# Patient Record
Sex: Male | Born: 1965 | Race: White | Hispanic: No | Marital: Married | Smoking: Never smoker
Health system: Southern US, Community
[De-identification: ages and names within clinical notes are randomized; demographics above are authoritative.]

## PROBLEM LIST (undated history)

## (undated) DIAGNOSIS — I255 Ischemic cardiomyopathy: Secondary | ICD-10-CM

## (undated) DIAGNOSIS — E8881 Metabolic syndrome: Secondary | ICD-10-CM

## (undated) DIAGNOSIS — Z9861 Coronary angioplasty status: Secondary | ICD-10-CM

## (undated) DIAGNOSIS — I2510 Atherosclerotic heart disease of native coronary artery without angina pectoris: Secondary | ICD-10-CM

## (undated) DIAGNOSIS — E785 Hyperlipidemia, unspecified: Secondary | ICD-10-CM

## (undated) DIAGNOSIS — I214 Non-ST elevation (NSTEMI) myocardial infarction: Secondary | ICD-10-CM

## (undated) DIAGNOSIS — R739 Hyperglycemia, unspecified: Secondary | ICD-10-CM

## (undated) DIAGNOSIS — N200 Calculus of kidney: Secondary | ICD-10-CM

## (undated) HISTORY — DX: Ischemic cardiomyopathy: I25.5

## (undated) HISTORY — DX: Metabolic syndrome: E88.81

## (undated) HISTORY — DX: Non-ST elevation (NSTEMI) myocardial infarction: I21.4

## (undated) HISTORY — PX: TRANSTHORACIC ECHOCARDIOGRAM: SHX275

## (undated) HISTORY — DX: Hyperlipidemia, unspecified: E78.5

## (undated) HISTORY — DX: Morbid (severe) obesity due to excess calories: E66.01

## (undated) HISTORY — DX: Hyperglycemia, unspecified: R73.9

---

## 2008-06-22 ENCOUNTER — Encounter: Admission: RE | Admit: 2008-06-22 | Discharge: 2008-06-22 | Payer: Self-pay | Admitting: Orthopaedic Surgery

## 2012-09-26 ENCOUNTER — Emergency Department (HOSPITAL_COMMUNITY): Payer: PRIVATE HEALTH INSURANCE

## 2012-09-26 ENCOUNTER — Encounter (HOSPITAL_COMMUNITY): Payer: Self-pay | Admitting: *Deleted

## 2012-09-26 ENCOUNTER — Inpatient Hospital Stay (HOSPITAL_COMMUNITY)
Admission: EM | Admit: 2012-09-26 | Discharge: 2012-09-30 | DRG: 247 | Disposition: A | Discharge status: Home / Self Care | Payer: PRIVATE HEALTH INSURANCE | Attending: Cardiology | Admitting: Cardiology

## 2012-09-26 DIAGNOSIS — N200 Calculus of kidney: Secondary | ICD-10-CM | POA: Diagnosis present

## 2012-09-26 DIAGNOSIS — R809 Proteinuria, unspecified: Secondary | ICD-10-CM | POA: Diagnosis present

## 2012-09-26 DIAGNOSIS — Z87442 Personal history of urinary calculi: Secondary | ICD-10-CM

## 2012-09-26 DIAGNOSIS — Z955 Presence of coronary angioplasty implant and graft: Secondary | ICD-10-CM

## 2012-09-26 DIAGNOSIS — E785 Hyperlipidemia, unspecified: Secondary | ICD-10-CM | POA: Diagnosis present

## 2012-09-26 DIAGNOSIS — E876 Hypokalemia: Secondary | ICD-10-CM | POA: Diagnosis not present

## 2012-09-26 DIAGNOSIS — E1169 Type 2 diabetes mellitus with other specified complication: Secondary | ICD-10-CM | POA: Diagnosis present

## 2012-09-26 DIAGNOSIS — E118 Type 2 diabetes mellitus with unspecified complications: Secondary | ICD-10-CM | POA: Diagnosis present

## 2012-09-26 DIAGNOSIS — I214 Non-ST elevation (NSTEMI) myocardial infarction: Secondary | ICD-10-CM

## 2012-09-26 DIAGNOSIS — E8881 Metabolic syndrome: Secondary | ICD-10-CM | POA: Diagnosis present

## 2012-09-26 DIAGNOSIS — IMO0002 Reserved for concepts with insufficient information to code with codable children: Secondary | ICD-10-CM | POA: Diagnosis present

## 2012-09-26 DIAGNOSIS — R739 Hyperglycemia, unspecified: Secondary | ICD-10-CM | POA: Diagnosis present

## 2012-09-26 DIAGNOSIS — Z6841 Body Mass Index (BMI) 40.0 and over, adult: Secondary | ICD-10-CM

## 2012-09-26 DIAGNOSIS — I2510 Atherosclerotic heart disease of native coronary artery without angina pectoris: Secondary | ICD-10-CM | POA: Diagnosis present

## 2012-09-26 DIAGNOSIS — D72829 Elevated white blood cell count, unspecified: Secondary | ICD-10-CM | POA: Diagnosis present

## 2012-09-26 HISTORY — DX: Calculus of kidney: N20.0

## 2012-09-26 LAB — URINALYSIS, ROUTINE W REFLEX MICROSCOPIC
Glucose, UA: >1000 mg/dL — AB
Ketones, ur: 15 mg/dL — AB
Leukocytes, UA: NEGATIVE
Nitrite: NEGATIVE
Protein, ur: 30 mg/dL — AB

## 2012-09-26 LAB — BASIC METABOLIC PANEL
CO2: 27 mEq/L (ref 19–32)
Calcium: 9.3 mg/dL (ref 8.4–10.5)
GFR calc non Af Amer: >90 mL/min (ref >90)
Glucose, Bld: 438 mg/dL — ABNORMAL HIGH (ref 70–99)
Potassium: 4.6 mEq/L (ref 3.5–5.1)
Sodium: 134 mEq/L — ABNORMAL LOW (ref 135–145)

## 2012-09-26 LAB — CBC
Hemoglobin: 15.6 g/dL (ref 13.0–17.0)
MCH: 30.1 pg (ref 26.0–34.0)
MCHC: 35.5 g/dL (ref 30.0–36.0)
Platelets: 249 10*3/uL (ref 150–400)
RBC: 5.19 MIL/uL (ref 4.22–5.81)

## 2012-09-26 LAB — URINE MICROSCOPIC-ADD ON

## 2012-09-26 LAB — POCT I-STAT TROPONIN I
Troponin i, poc: 0.44 ng/mL (ref 0.00–0.08)
Troponin i, poc: 2.53 ng/mL (ref 0.00–0.08)

## 2012-09-26 LAB — PROTIME-INR: Prothrombin Time: 13.2 seconds (ref 11.6–15.2)

## 2012-09-26 LAB — APTT: aPTT: 26 seconds (ref 24–37)

## 2012-09-26 MED ORDER — HEPARIN (PORCINE) IN NACL 100-0.45 UNIT/ML-% IJ SOLN
1000.0000 [IU]/h | INTRAMUSCULAR | Status: DC
Start: 2012-09-26 — End: 2012-09-26

## 2012-09-26 MED ORDER — SODIUM CHLORIDE 0.9 % IV SOLN
1000.0000 mL | INTRAVENOUS | Status: DC
Start: 2012-09-26 — End: 2012-09-26
  Administered 2012-09-26: 1000 mL via INTRAVENOUS

## 2012-09-26 MED ORDER — NITROGLYCERIN 0.4 MG SL SUBL: 0.4000 mg | SUBLINGUAL_TABLET | SUBLINGUAL | Status: DC | PRN

## 2012-09-26 MED ORDER — HEPARIN (PORCINE) IN NACL 100-0.45 UNIT/ML-% IJ SOLN
1850.0000 [IU]/h | INTRAMUSCULAR | Status: DC
  Administered 2012-09-26: 1250 [IU]/h via INTRAVENOUS
  Administered 2012-09-27 (×2): 1850 [IU]/h via INTRAVENOUS
  Filled 2012-09-26 (×5): qty 250

## 2012-09-26 MED ORDER — HEPARIN BOLUS VIA INFUSION: 4000.0000 [IU] | Freq: Once | INTRAVENOUS | Status: DC

## 2012-09-26 MED ORDER — ONDANSETRON HCL 4 MG/2ML IJ SOLN
4.0000 mg | Freq: Four times a day (QID) | INTRAMUSCULAR | Status: DC | PRN
  Administered 2012-09-27: 4 mg via INTRAVENOUS
  Filled 2012-09-26: qty 2

## 2012-09-26 MED ORDER — NITROGLYCERIN IN D5W 200-5 MCG/ML-% IV SOLN: 2.0000 ug/min | INTRAVENOUS | Status: DC

## 2012-09-26 MED ORDER — NITROGLYCERIN IN D5W 200-5 MCG/ML-% IV SOLN
2.0000 ug/min | Freq: Once | INTRAVENOUS | Status: AC
  Administered 2012-09-26: 5 ug/min via INTRAVENOUS
  Filled 2012-09-26: qty 250

## 2012-09-26 MED ORDER — SODIUM CHLORIDE 0.9 % IV SOLN
INTRAVENOUS | Status: DC
  Administered 2012-09-26: via INTRAVENOUS

## 2012-09-26 MED ORDER — INSULIN ASPART 100 UNIT/ML ~~LOC~~ SOLN
0.0000 [IU] | SUBCUTANEOUS | Status: DC
  Administered 2012-09-27: 11 [IU] via SUBCUTANEOUS
  Administered 2012-09-27: 8 [IU] via SUBCUTANEOUS
  Administered 2012-09-27: 5 [IU] via SUBCUTANEOUS

## 2012-09-26 MED ORDER — ACETAMINOPHEN 325 MG PO TABS
650.0000 mg | ORAL_TABLET | ORAL | Status: DC | PRN
  Administered 2012-09-27 – 2012-09-28 (×7): 650 mg via ORAL
  Filled 2012-09-26 (×7): qty 2

## 2012-09-26 MED ORDER — ASPIRIN EC 81 MG PO TBEC
81.0000 mg | DELAYED_RELEASE_TABLET | Freq: Every day | ORAL | Status: DC
  Administered 2012-09-27 – 2012-09-30 (×3): 81 mg via ORAL
  Filled 2012-09-26 (×4): qty 1

## 2012-09-26 MED ORDER — HEPARIN BOLUS VIA INFUSION
4000.0000 [IU] | Freq: Once | INTRAVENOUS | Status: AC
  Administered 2012-09-26: 4000 [IU] via INTRAVENOUS

## 2012-09-26 MED ORDER — ATORVASTATIN CALCIUM 80 MG PO TABS
80.0000 mg | ORAL_TABLET | Freq: Every day | ORAL | Status: DC
  Administered 2012-09-27 – 2012-09-29 (×4): 80 mg via ORAL
  Filled 2012-09-26 (×5): qty 1

## 2012-09-26 MED ORDER — METOPROLOL TARTRATE 12.5 MG HALF TABLET
12.5000 mg | ORAL_TABLET | Freq: Two times a day (BID) | ORAL | Status: DC
  Administered 2012-09-27: 12.5 mg via ORAL
  Filled 2012-09-26 (×3): qty 1

## 2012-09-26 MED ORDER — NITROGLYCERIN 0.4 MG SL SUBL
0.4000 mg | SUBLINGUAL_TABLET | SUBLINGUAL | Status: DC | PRN
  Administered 2012-09-26: 0.4 mg via SUBLINGUAL
  Filled 2012-09-26: qty 25

## 2012-09-26 MED ORDER — TAMSULOSIN HCL 0.4 MG PO CAPS
0.4000 mg | ORAL_CAPSULE | Freq: Every day | ORAL | Status: DC
  Administered 2012-09-27 – 2012-09-29 (×3): 0.4 mg via ORAL
  Filled 2012-09-26 (×4): qty 1

## 2012-09-26 MED ORDER — GI COCKTAIL ~~LOC~~
30.0000 mL | Freq: Once | ORAL | Status: AC
  Administered 2012-09-26: 30 mL via ORAL
  Filled 2012-09-26: qty 30

## 2012-09-26 NOTE — ED Notes (Signed)
Dr. Davidson was notified of the critical i-STAT cTnl. 

## 2012-09-26 NOTE — ED Notes (Signed)
C/o onset SSCP non radiating while looking at [employer], non exertional. Denies SOB, n/v, diaphoresis. States pain intensity varies while at rest. Reports pain intermittent, describes pain as pressure. States pain increases with palpation.

## 2012-09-26 NOTE — ED Notes (Signed)
EDP made aware of CP, states to run Nitro drip at 66mcg/min

## 2012-09-26 NOTE — ED Notes (Signed)
Dr. Ignacia Palma was notified of the critical i-STAT cTnl.

## 2012-09-26 NOTE — ED Provider Notes (Signed)
History     CSN: [id number]  Arrival date & time 09/26/12  1459   First MD Initiated Contact with Patient 09/26/12 1523      Chief Complaint  Patient presents with  . Chest Pain    (Consider location/radiation/quality/duration/timing/severity/associated sxs/prior treatment) Patient is a 47 y.o. male presenting with chest pain. The history is provided by the patient. No language interpreter was used.  Chest Pain Pain location:  Substernal area (Pt had eaten at [employer] about 30-45 minutes prior to onset of chest pain.  He was uncrating a [employer], newspaper and felt onset of substernal chest pain.) Pain quality: dull   Pain quality comment:  Pain rated at a 7 initially, now is a 2 or less. Pain radiates to:  Does not radiate Pain severity:  Moderate Onset quality:  Sudden Duration:  45 minutes Timing:  Constant Progression:  Partially resolved Chronicity:  New Relieved by:  Aspirin Worsened by:  Nothing tried Ineffective treatments:  None tried Associated symptoms: no fever and no palpitations   Risk factors: male sex and obesity     Past Medical History  Diagnosis Date  . Kidney stone     History reviewed. No pertinent past surgical history.  No family history on file.  History  Substance Use Topics  . Smoking status: Not on file  . Smokeless tobacco: Not on file  . Alcohol Use: Not on file      Review of Systems  Constitutional: Negative.  Negative for fever and chills.  HENT: Negative.   Eyes: Negative.   Respiratory: Negative.   Cardiovascular: Positive for chest pain. Negative for palpitations and leg swelling.  Gastrointestinal: Negative.        Some prior Hx of esophageal reflux.  Genitourinary: Negative.        Prior Hx kidney stones.  Musculoskeletal: Negative.   Skin:       Sweating with this episode.  Neurological: Negative.   Psychiatric/Behavioral: Negative.     Allergies  Review of patient's allergies indicates no known  allergies.  Home Medications  No current outpatient prescriptions on file.  BP 139/96  Pulse 72  Temp(Src) 98 F (36.7 C) (Oral)  Resp 18  Ht 5\' 8"  (1.727 m)  Wt 300 lb (136.079 kg)  BMI 45.63 kg/m2  SpO2 98%  Physical Exam  Nursing note and vitals reviewed. Constitutional: He is oriented to person, place, and time.  Morbidly obese man, no distress at rest.  HENT:  Head: Normocephalic and atraumatic.  Right Ear: External ear normal.  Left Ear: External ear normal.  Mouth/Throat: Oropharynx is clear and moist.  Eyes: Conjunctivae and EOM are normal. Pupils are equal, round, and reactive to light.  Neck: Normal range of motion. Neck supple.  Cardiovascular: Normal rate, regular rhythm and normal heart sounds.   Pulmonary/Chest: Effort normal and breath sounds normal.  Abdominal: Soft. Bowel sounds are normal. He exhibits no distension. There is no tenderness.  Musculoskeletal: Normal range of motion. He exhibits no edema and no tenderness.  Neurological: He is alert and oriented to person, place, and time.  No sensory or motor deficit.  Skin: Skin is warm and dry.  Psychiatric: He has a normal mood and affect. His behavior is normal.    ED Course  CRITICAL CARE Performed by: Osvaldo Human Authorized by: Osvaldo Human Total critical care time: 30 minutes Critical care was necessary to treat or prevent imminent or life-threatening deterioration of the following conditions: 47 yo man  with vague chest pain, initial EKG and troponin negative, but 3 hour troponin was positive, leading to Dx of NSTEMI.  Rx with IV nitroglycerin and heparin, Cardiology called to admit him. Critical care was time spent personally by me on the following activities: discussions with consultants, evaluation of patient's response to treatment, examination of patient, obtaining history from patient or surrogate, ordering and performing treatments and interventions, ordering and review of  laboratory studies, ordering and review of radiographic studies and re-evaluation of patient's condition.   (including critical care time)  Labs Reviewed  CBC  BASIC METABOLIC PANEL   0:98 PM  Date: 09/26/2012  Rate: 70  Rhythm: normal sinus rhythm  QRS Axis: normal  Intervals: normal QRS:  Poor R wave progression in precordial leads suggests possible old anterior myocardial infarction.  ST/T Wave abnormalities: normal  Conduction Disutrbances:none  Narrative Interpretation: Abnormal EKG  Old EKG Reviewed: none available  3:42 PM Pt was seen and had physical examination.  He rated his pain at a 7 at home, now it is a 2.  Lab workup ordered.  EKG nonacute.  Results for orders placed during the hospital encounter of 09/26/12  MRSA PCR SCREENING      Result Value Range   MRSA by PCR NEGATIVE  NEGATIVE  CBC      Result Value Range   WBC 12.0 (*) 4.0 - 10.5 K/uL   RBC 5.19  4.22 - 5.81 MIL/uL   Hemoglobin 15.6  13.0 - 17.0 g/dL   HCT 11.9  14.7 - 82.9 %   MCV 84.6  78.0 - 100.0 fL   MCH 30.1  26.0 - 34.0 pg   MCHC 35.5  30.0 - 36.0 g/dL   RDW 56.2  13.0 - 86.5 %   Platelets 249  150 - 400 K/uL  BASIC METABOLIC PANEL      Result Value Range   Sodium 134 (*) 135 - 145 mEq/L   Potassium 4.6  3.5 - 5.1 mEq/L   Chloride 97  96 - 112 mEq/L   CO2 27  19 - 32 mEq/L   Glucose, Bld 438 (*) 70 - 99 mg/dL   BUN 9  6 - 23 mg/dL   Creatinine, Ser 7.84  0.50 - 1.35 mg/dL   Calcium 9.3  8.4 - 69.6 mg/dL   GFR calc non Af Amer >90  >90 mL/min   GFR calc Af Amer >90  >90 mL/min  APTT      Result Value Range   aPTT 26  24 - 37 seconds  URINALYSIS, ROUTINE W REFLEX MICROSCOPIC      Result Value Range   Color, Urine YELLOW  YELLOW   APPearance CLEAR  CLEAR   Specific Gravity, Urine 1.031 (*) 1.005 - 1.030   pH 7.5  5.0 - 8.0   Glucose, UA >1000 (*) NEGATIVE mg/dL   Hgb urine dipstick SMALL (*) NEGATIVE   Bilirubin Urine NEGATIVE  NEGATIVE   Ketones, ur 15 (*) NEGATIVE mg/dL    Protein, ur 30 (*) NEGATIVE mg/dL   Urobilinogen, UA 0.2  0.0 - 1.0 mg/dL   Nitrite NEGATIVE  NEGATIVE   Leukocytes, UA NEGATIVE  NEGATIVE  PROTIME-INR      Result Value Range   Prothrombin Time 13.2  11.6 - 15.2 seconds   INR 1.01  0.00 - 1.49  URINE MICROSCOPIC-ADD ON      Result Value Range   WBC, UA 0-2  <3 WBC/hpf   RBC / HPF 3-6  <  3 RBC/hpf  TROPONIN I      Result Value Range   Troponin I 10.55 (*) <0.30 ng/mL  TROPONIN I      Result Value Range   Troponin I >20.00 (*) <0.30 ng/mL  PROTIME-INR      Result Value Range   Prothrombin Time 13.5  11.6 - 15.2 seconds   INR 1.04  0.00 - 1.49  APTT      Result Value Range   aPTT 38 (*) 24 - 37 seconds  TSH      Result Value Range   TSH 0.473  0.350 - 4.500 uIU/mL  COMPREHENSIVE METABOLIC PANEL      Result Value Range   Sodium 137  135 - 145 mEq/L   Potassium 4.4  3.5 - 5.1 mEq/L   Chloride 101  96 - 112 mEq/L   CO2 26  19 - 32 mEq/L   Glucose, Bld 311 (*) 70 - 99 mg/dL   BUN 9  6 - 23 mg/dL   Creatinine, Ser 0.98  0.50 - 1.35 mg/dL   Calcium 8.7  8.4 - 11.9 mg/dL   Total Protein 6.7  6.0 - 8.3 g/dL   Albumin 3.4 (*) 3.5 - 5.2 g/dL   AST 51 (*) 0 - 37 U/L   ALT 32  0 - 53 U/L   Alkaline Phosphatase 104  39 - 117 U/L   Total Bilirubin 0.7  0.3 - 1.2 mg/dL   GFR calc non Af Amer >90  >90 mL/min   GFR calc Af Amer >90  >90 mL/min  PRO B NATRIURETIC PEPTIDE      Result Value Range   Pro B Natriuretic peptide (BNP) 95.6  0 - 125 pg/mL  HEMOGLOBIN A1C      Result Value Range   Hemoglobin A1C 11.3 (*) <5.7 %   Mean Plasma Glucose 278 (*) <117 mg/dL  LIPID PANEL      Result Value Range   Cholesterol 248 (*) 0 - 200 mg/dL   Triglycerides 147 (*) <150 mg/dL   HDL 46  >82 mg/dL   Total CHOL/HDL Ratio 5.4     VLDL 36  0 - 40 mg/dL   LDL Cholesterol 956 (*) 0 - 99 mg/dL  BASIC METABOLIC PANEL      Result Value Range   Sodium 137  135 - 145 mEq/L   Potassium 4.0  3.5 - 5.1 mEq/L   Chloride 102  96 - 112 mEq/L   CO2 25   19 - 32 mEq/L   Glucose, Bld 264 (*) 70 - 99 mg/dL   BUN 8  6 - 23 mg/dL   Creatinine, Ser 2.13  0.50 - 1.35 mg/dL   Calcium 8.5  8.4 - 08.6 mg/dL   GFR calc non Af Amer >90  >90 mL/min   GFR calc Af Amer >90  >90 mL/min  CBC      Result Value Range   WBC 12.3 (*) 4.0 - 10.5 K/uL   RBC 4.96  4.22 - 5.81 MIL/uL   Hemoglobin 14.9  13.0 - 17.0 g/dL   HCT 57.8  46.9 - 62.9 %   MCV 84.9  78.0 - 100.0 fL   MCH 30.0  26.0 - 34.0 pg   MCHC 35.4  30.0 - 36.0 g/dL   RDW 52.8  41.3 - 24.4 %   Platelets 256  150 - 400 K/uL  MAGNESIUM      Result Value Range   Magnesium 1.9  1.5 -  2.5 mg/dL  HEPARIN LEVEL (UNFRACTIONATED)      Result Value Range   Heparin Unfractionated <0.10 (*) 0.30 - 0.70 IU/mL  HEPARIN LEVEL (UNFRACTIONATED)      Result Value Range   Heparin Unfractionated 0.36  0.30 - 0.70 IU/mL  GLUCOSE, CAPILLARY      Result Value Range   Glucose-Capillary 219 (*) 70 - 99 mg/dL  URINALYSIS, ROUTINE W REFLEX MICROSCOPIC      Result Value Range   Color, Urine YELLOW  YELLOW   APPearance CLEAR  CLEAR   Specific Gravity, Urine 1.025  1.005 - 1.030   pH 7.0  5.0 - 8.0   Glucose, UA >1000 (*) NEGATIVE mg/dL   Hgb urine dipstick MODERATE (*) NEGATIVE   Bilirubin Urine NEGATIVE  NEGATIVE   Ketones, ur 15 (*) NEGATIVE mg/dL   Protein, ur 30 (*) NEGATIVE mg/dL   Urobilinogen, UA 1.0  0.0 - 1.0 mg/dL   Nitrite NEGATIVE  NEGATIVE   Leukocytes, UA NEGATIVE  NEGATIVE  GLUCOSE, CAPILLARY      Result Value Range   Glucose-Capillary 304 (*) 70 - 99 mg/dL  GLUCOSE, CAPILLARY      Result Value Range   Glucose-Capillary 279 (*) 70 - 99 mg/dL  GLUCOSE, CAPILLARY      Result Value Range   Glucose-Capillary 262 (*) 70 - 99 mg/dL  URINE MICROSCOPIC-ADD ON      Result Value Range   WBC, UA 0-2  <3 WBC/hpf   RBC / HPF 3-6  <3 RBC/hpf  POCT I-STAT TROPONIN I      Result Value Range   Troponin i, poc 0.02  0.00 - 0.08 ng/mL   Comment 3           POCT I-STAT TROPONIN I      Result Value  Range   Troponin i, poc 0.44 (*) 0.00 - 0.08 ng/mL   Comment NOTIFIED PHYSICIAN     Comment 3           POCT I-STAT TROPONIN I      Result Value Range   Troponin i, poc 2.53 (*) 0.00 - 0.08 ng/mL   Comment NOTIFIED PHYSICIAN     Comment 3            Dg Chest 2 View  09/26/2012  *RADIOLOGY REPORT*  Clinical Data: Chest pain.  CHEST - 2 VIEW  Comparison: None  Findings: The cardiomediastinal silhouette is unremarkable.  Mild peribronchial thickening is present. There is no evidence of focal airspace disease, pulmonary edema, suspicious pulmonary nodule/mass, pleural effusion, or pneumothorax. No acute bony abnormalities are identified.  IMPRESSION: No evidence of acute cardiopulmonary disease.   Original Report Authenticated By: Harmon Pier, M.D.     Course in ED:  Pt was seen and had physical examination.  Lab tests were ordered.  Initial EKG and TNI were negative, but his 3 hour TNI was high, showing that he had had an NSTEMI.  IV NTG and heparin were started.  [facility] was called to admit him.   1. NSTEMI (non-ST elevated myocardial infarction)         Carleene Cooper III, MD 09/27/12 [phone]

## 2012-09-26 NOTE — Progress Notes (Signed)
ANTICOAGULATION CONSULT NOTE - Initial Consult  Pharmacy Consult for Heparin Indication: NSTEMI  No Known Allergies  Patient Measurements: Height: 5\' 8"  (172.7 cm) Weight: 293 lb (132.904 kg) IBW/kg (Calculated) : 68.4 Heparin Dosing Weight: 99.8kg  Vital Signs: Temp: 98 F (36.7 C) (05/10 1509) Temp src: Oral (05/10 1509) BP: 132/90 mmHg (05/10 1915) Pulse Rate: 74 (05/10 1915)  Labs:  Recent Labs  09/26/12 1544  HGB 15.6  HCT 43.9  PLT 249  APTT 26  LABPROT 13.2  INR 1.01  CREATININE 0.95    Estimated Creatinine Clearance: 130.8 ml/min (by C-G formula based on Cr of 0.95).   Medical History: Past Medical History  Diagnosis Date  . Kidney stone     Medications:  Scheduled:  . [COMPLETED] gi cocktail  30 mL Oral Once  . nitroGLYCERIN  2-200 mcg/min Intravenous Once    Assessment: 47 yr old male on heparin for NSTEMI.    Goal of Therapy:  Heparin level 0.3-0.7 units/ml Monitor platelets by anticoagulation protocol: Yes   Plan:  Heparin 4000 units IV bolus x1, then run heparin drip at 1250 units/hr. Check heparin level in 6 hours. Daily heparin level/cbc.  Wendie Simmer, PharmD, BCPS Clinical Pharmacist  Pager: [phone]

## 2012-09-26 NOTE — ED Notes (Signed)
C/o SSCP onset 30 minutes after eating [employer]. Denies SOB, n/v, diaphoresis. Pt took 324mg  ASA, given NTG x2 by EMS with relief.

## 2012-09-26 NOTE — ED Provider Notes (Signed)
History     CSN: [id number]  Arrival date & time 09/26/12  1459   First MD Initiated Contact with Patient 09/26/12 1523      Chief Complaint  Patient presents with  . Chest Pain    (Consider location/radiation/quality/duration/timing/severity/associated sxs/prior treatment) Patient is a 47 y.o. male presenting with chest pain. The history is provided by the patient. No language interpreter was used.  Chest Pain Pain location:  Substernal area (Patient had been at [employer] 30-[place] minutes prior to the onset of chest pain.) Pain quality: dull   Pain quality comment:  Pain was like a twinge of pain in the chest that did not go away with rest.   Pain radiates to:  Does not radiate Pain severity:  Moderate (Rated initially at a 7, on arrival here rated at a 2.  ) Onset quality:  Sudden Timing:  Intermittent Progression:  Waxing and waning Chronicity:  New Context: eating   Relieved by:  Nothing Worsened by:  Nothing tried Ineffective treatments:  Aspirin Associated symptoms: no cough, no fever and no shortness of breath     Past Medical History  Diagnosis Date  . Kidney stone     History reviewed. No pertinent past surgical history.  No family history on file.  History  Substance Use Topics  . Smoking status: Not on file  . Smokeless tobacco: Not on file  . Alcohol Use: Not on file      Review of Systems  Constitutional: Negative for fever and chills.  HENT: Negative.   Eyes: Negative.   Respiratory: Negative.  Negative for cough and shortness of breath.   Cardiovascular: Positive for chest pain.  Gastrointestinal: Negative.   Genitourinary: Negative.   Musculoskeletal: Negative.   Skin: Negative.   Neurological: Negative.   Psychiatric/Behavioral: Negative.     Allergies  Review of patient's allergies indicates no known allergies.  Home Medications   Current Outpatient Rx  Name  Route  Sig  Dispense  Refill  . aspirin EC 81 MG tablet   Oral   Take 324  mg by mouth once.         . tamsulosin (FLOMAX) 0.4 MG CAPS   Oral   Take 0.4 mg by mouth daily after supper.           BP 139/96  Pulse 72  Temp(Src) 98 F (36.7 C) (Oral)  Resp 18  Ht 5\' 8"  (1.727 m)  Wt 300 lb (136.079 kg)  BMI 45.63 kg/m2  SpO2 98%  Physical Exam  Nursing note and vitals reviewed. Constitutional: He is oriented to person, place, and time. He appears well-developed.  Morbidly obese man in no distress at rest.  HENT:  Head: Normocephalic and atraumatic.  Right Ear: External ear normal.  Left Ear: External ear normal.  Mouth/Throat: Oropharynx is clear and moist.  Eyes: Conjunctivae and EOM are normal. Pupils are equal, round, and reactive to light. No scleral icterus.  Neck: Normal range of motion. Neck supple.  Cardiovascular: Normal rate, regular rhythm and normal heart sounds.   Pulmonary/Chest: Effort normal and breath sounds normal.  Abdominal: Soft. Bowel sounds are normal.  Musculoskeletal: Normal range of motion. He exhibits no edema and no tenderness.  Neurological: He is alert and oriented to person, place, and time.  No sensory or motor deficit.  Skin: Skin is warm and dry.  Psychiatric: He has a normal mood and affect. His behavior is normal.    ED Course  Procedures (including critical care  time)  Results for orders placed during the hospital encounter of 09/26/12  CBC      Result Value Range   WBC 12.0 (*) 4.0 - 10.5 K/uL   RBC 5.19  4.22 - 5.81 MIL/uL   Hemoglobin 15.6  13.0 - 17.0 g/dL   HCT 16.1  09.6 - 04.5 %   MCV 84.6  78.0 - 100.0 fL   MCH 30.1  26.0 - 34.0 pg   MCHC 35.5  30.0 - 36.0 g/dL   RDW 40.9  81.1 - 91.4 %   Platelets 249  150 - 400 K/uL  BASIC METABOLIC PANEL      Result Value Range   Sodium 134 (*) 135 - 145 mEq/L   Potassium 4.6  3.5 - 5.1 mEq/L   Chloride 97  96 - 112 mEq/L   CO2 27  19 - 32 mEq/L   Glucose, Bld 438 (*) 70 - 99 mg/dL   BUN 9  6 - 23 mg/dL   Creatinine, Ser 7.82  0.50 - 1.35 mg/dL    Calcium 9.3  8.4 - 95.6 mg/dL   GFR calc non Af Amer >90  >90 mL/min   GFR calc Af Amer >90  >90 mL/min  APTT      Result Value Range   aPTT 26  24 - 37 seconds  URINALYSIS, ROUTINE W REFLEX MICROSCOPIC      Result Value Range   Color, Urine YELLOW  YELLOW   APPearance CLEAR  CLEAR   Specific Gravity, Urine 1.031 (*) 1.005 - 1.030   pH 7.5  5.0 - 8.0   Glucose, UA >1000 (*) NEGATIVE mg/dL   Hgb urine dipstick SMALL (*) NEGATIVE   Bilirubin Urine NEGATIVE  NEGATIVE   Ketones, ur 15 (*) NEGATIVE mg/dL   Protein, ur 30 (*) NEGATIVE mg/dL   Urobilinogen, UA 0.2  0.0 - 1.0 mg/dL   Nitrite NEGATIVE  NEGATIVE   Leukocytes, UA NEGATIVE  NEGATIVE  PROTIME-INR      Result Value Range   Prothrombin Time 13.2  11.6 - 15.2 seconds   INR 1.01  0.00 - 1.49  URINE MICROSCOPIC-ADD ON      Result Value Range   WBC, UA 0-2  <3 WBC/hpf   RBC / HPF 3-6  <3 RBC/hpf  POCT I-STAT TROPONIN I      Result Value Range   Troponin i, poc 0.02  0.00 - 0.08 ng/mL   Comment 3           POCT I-STAT TROPONIN I      Result Value Range   Troponin i, poc 0.44 (*) 0.00 - 0.08 ng/mL   Comment NOTIFIED PHYSICIAN     Comment 3            Dg Chest 2 View  09/26/2012  *RADIOLOGY REPORT*  Clinical Data: Chest pain.  CHEST - 2 VIEW  Comparison: None  Findings: The cardiomediastinal silhouette is unremarkable.  Mild peribronchial thickening is present. There is no evidence of focal airspace disease, pulmonary edema, suspicious pulmonary nodule/mass, pleural effusion, or pneumothorax. No acute bony abnormalities are identified.  IMPRESSION: No evidence of acute cardiopulmonary disease.   Original Report Authenticated By: Harmon Pier, M.D.     Course in ED:  Pt seen and had physical exam and lab tests.  His EKG was nonacute, his initial TNI was negative, but his 3 hour TNI was elevated at 0.44.  Call to Leeann Must, M.D., on call for Cardiology, who will  admit him.  Rx with IV heparin, IV nitroglycerin.  Lab results and  course of treatment discussed with pt and with his wife.    1. NSTEMI (non-ST elevated myocardial infarction)           Carleene Cooper III, MD 09/26/12 2020

## 2012-09-26 NOTE — H&P (Signed)
Thomas Beck is an 47 y.o. male.   Chief Complaint: chest pain HPI: 47 yo man with PMH of multiple kidney stones (90+) who presented to [facility] ER after developing chest pain 30-45 minutes after eating at [employer] while he was opening up a new lawn mower. He described the pain as pressure sensation, no radiation and located substernally. He has no arm/neck pain and no radiation. Pain as bad as 7/10 and down to 2/10 immediately after aspirin, nitroglycerin. He currently has between no pain and 1/10. He has received a large aspirin. The ER called cardiology when his troponin I resulted at 0.44 consistent with an NSTEMI. IV heparin already ordered and currently infusing on my evaluation. 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 He walks 4 flights of stairs multiple times a day without difficulty. He also plays golf twice a week. He's never had chest pain before. He sleeps on 1-2 pillows, no SOB/DOE or PND. His family is healthy - no known diabetes, hypertension or CAD; however, his maternal grandmother died suddenly from unknown cause at age 16. He's lost from 331 lbs to 293 lbs walking more and eating better. He is not known to have diabetes but I informed him of his elevated sugars. He understands that he has what appears to be a small NSTEMI at this time.    Past Medical History  Diagnosis Date  . Kidney stone     History reviewed. No pertinent past surgical history.  No family history on file. Social History:  has no tobacco, alcohol, and drug history on file. No tobacco, social etoh, no drugs Allergies: No Known Allergies   (Not in a hospital admission)  Results for orders placed during the hospital encounter of 09/26/12 (from the past 48 hour(s))  CBC     Status: Abnormal   Collection Time    09/26/12  3:44 PM      Result Value Range   WBC 12.0 (*) 4.0 - 10.5 K/uL   RBC 5.19  4.22 - 5.81 MIL/uL   Hemoglobin 15.6   13.0 - 17.0 g/dL   HCT 78.2  95.6 - 21.3 %   MCV 84.6  78.0 - 100.0 fL   MCH 30.1  26.0 - 34.0 pg   MCHC 35.5  30.0 - 36.0 g/dL   RDW 08.6  57.8 - 46.9 %   Platelets 249  150 - 400 K/uL  BASIC METABOLIC PANEL     Status: Abnormal   Collection Time    09/26/12  3:44 PM      Result Value Range   Sodium 134 (*) 135 - 145 mEq/L   Potassium 4.6  3.5 - 5.1 mEq/L   Chloride 97  96 - 112 mEq/L   CO2 27  19 - 32 mEq/L   Glucose, Bld 438 (*) 70 - 99 mg/dL   BUN 9  6 - 23 mg/dL   Creatinine, Ser 6.29  0.50 - 1.35 mg/dL   Calcium 9.3  8.4 - 52.8 mg/dL   GFR calc non Af Amer >90  >90 mL/min   GFR calc Af Amer >90  >90 mL/min   Comment:            The eGFR has been calculated     using the CKD EPI equation.     This calculation has not been     validated in all clinical  situations.     eGFR's persistently     <90 mL/min signify     possible Chronic Kidney Disease.  APTT     Status: None   Collection Time    09/26/12  3:44 PM      Result Value Range   aPTT 26  24 - 37 seconds  PROTIME-INR     Status: None   Collection Time    09/26/12  3:44 PM      Result Value Range   Prothrombin Time 13.2  11.6 - 15.2 seconds   INR 1.01  0.00 - 1.49  POCT I-STAT TROPONIN I     Status: None   Collection Time    09/26/12  4:00 PM      Result Value Range   Troponin i, poc 0.02  0.00 - 0.08 ng/mL   Comment 3            Comment: Due to the release kinetics of cTnI,     a negative result within the first hours     of the onset of symptoms does not rule out     myocardial infarction with certainty.     If myocardial infarction is still suspected,     repeat the test at appropriate intervals.  URINALYSIS, ROUTINE W REFLEX MICROSCOPIC     Status: Abnormal   Collection Time    09/26/12  5:04 PM      Result Value Range   Color, Urine YELLOW  YELLOW   APPearance CLEAR  CLEAR   Specific Gravity, Urine 1.031 (*) 1.005 - 1.030   pH 7.5  5.0 - 8.0   Glucose, UA >1000 (*) NEGATIVE mg/dL   Hgb  urine dipstick SMALL (*) NEGATIVE   Bilirubin Urine NEGATIVE  NEGATIVE   Ketones, ur 15 (*) NEGATIVE mg/dL   Protein, ur 30 (*) NEGATIVE mg/dL   Urobilinogen, UA 0.2  0.0 - 1.0 mg/dL   Nitrite NEGATIVE  NEGATIVE   Leukocytes, UA NEGATIVE  NEGATIVE  URINE MICROSCOPIC-ADD ON     Status: None   Collection Time    09/26/12  5:04 PM      Result Value Range   WBC, UA 0-2  <3 WBC/hpf   RBC / HPF 3-6  <3 RBC/hpf  POCT I-STAT TROPONIN I     Status: Abnormal   Collection Time    09/26/12  7:48 PM      Result Value Range   Troponin i, poc 0.44 (*) 0.00 - 0.08 ng/mL   Comment NOTIFIED PHYSICIAN     Comment 3            Comment: Due to the release kinetics of cTnI,     a negative result within the first hours     of the onset of symptoms does not rule out     myocardial infarction with certainty.     If myocardial infarction is still suspected,     repeat the test at appropriate intervals.   Dg Chest 2 View  09/26/2012  *RADIOLOGY REPORT*  Clinical Data: Chest pain.  CHEST - 2 VIEW  Comparison: None  Findings: The cardiomediastinal silhouette is unremarkable.  Mild peribronchial thickening is present. There is no evidence of focal airspace disease, pulmonary edema, suspicious pulmonary nodule/mass, pleural effusion, or pneumothorax. No acute bony abnormalities are identified.  IMPRESSION: No evidence of acute cardiopulmonary disease.   Original Report Authenticated By: Harmon Pier, M.D.     Review of Systems  Constitutional: Positive  for weight loss. Negative for fever, chills and malaise/fatigue.  HENT: Negative for hearing loss, neck pain and tinnitus.   Eyes: Negative for blurred vision, double vision and photophobia.  Respiratory: Negative for cough, hemoptysis, sputum production and shortness of breath.   Cardiovascular: Positive for chest pain. Negative for palpitations, orthopnea, claudication, leg swelling and PND.  Gastrointestinal: Negative for heartburn, nausea, vomiting and  abdominal pain.  Genitourinary: Negative for dysuria, urgency and frequency.  Musculoskeletal: Negative for myalgias and back pain.  Skin: Negative for itching and rash.  Neurological: Negative for dizziness, tingling, tremors and headaches.  Endo/Heme/Allergies: Negative for environmental allergies and polydipsia. Does not bruise/bleed easily.  Psychiatric/Behavioral: Negative for depression, suicidal ideas and substance abuse.    Blood pressure 132/90, pulse 74, temperature 98 F (36.7 C), temperature source Oral, resp. rate 23, height 5\' 8"  (1.727 m), weight 132.904 kg (293 lb), SpO2 96.00%. Physical Exam  Nursing note and vitals reviewed. Constitutional: He is oriented to person, place, and time. He appears well-developed and well-nourished. No distress.  Pleasant, age appropriate appearing man, overweight  HENT:  Head: Normocephalic and atraumatic.  Nose: Nose normal.  Mouth/Throat: Oropharynx is clear and moist. No oropharyngeal exudate.  Eyes: Conjunctivae and EOM are normal. Pupils are equal, round, and reactive to light. No scleral icterus.  Neck: Normal range of motion. Neck supple. No JVD present. No tracheal deviation present. No thyromegaly present.  Cardiovascular: Normal rate, regular rhythm, normal heart sounds and intact distal pulses.  Exam reveals no gallop.   No murmur heard. Respiratory: Effort normal and breath sounds normal. No respiratory distress. He has no wheezes.  GI: Soft. Bowel sounds are normal. He exhibits no distension. There is no tenderness. There is no rebound.  Musculoskeletal: Normal range of motion. He exhibits no edema and no tenderness.  Neurological: He is oriented to person, place, and time. He has normal reflexes. No cranial nerve deficit. Coordination normal.  Skin: Skin is warm and dry. No rash noted. He is not diaphoretic. No erythema.  Psychiatric: He has a normal mood and affect. His behavior is normal.   labs reviewed; wbc 12, h/h  15.6/43.9, plt 299, na 134, K 4.6, bun/cr 9/0.95, glucose 438, troponin po 0.02 --> 0.44 U/a 1.031/> 100 glucose, 30 protein ECG with V1-V3 q waves (qs), nl axis, NSR  Problem List Elevated troponin/NSTEMI Chest Pain Hyperglycemia and likely new diagnosis of diabetes Leukocytosis > 1000 glucose in urine Proteinuria Kidney stones in past  Assessment/Plan 47 yo man with history of renal stones with new onset chest pain and found to have elevated troponin and hyperglycemia consistent with NSTEMI. Differential diagnosis is stress cardiomyopathy, myocarditis, NSTEMI and much rarer causes of troponin elevation. Chest pain history/story, body habitus and elevated glucose consistent with NSTEMI. I discussed the ramifications of elevated troponin and likely need for LHC on Monday to evaluate his coronaries. I reviewed the general aspects of the procedures and the potential he might need a stent or even the possibility of 3v disease requiring bypass surgery.  - admission to step-down, telemetry - trend troponins - heparin gtt per protocol - asa daily - low dose metoprolol, NTG for pain - atorvastatin 80 mg qHS - tsh, hba1c, lipid panel - Echocardiogram in AM to assess LV function and wall motion abnormalities  Crystalle Popwell 09/26/2012, 8:20 PM

## 2012-09-27 ENCOUNTER — Encounter (HOSPITAL_COMMUNITY): Payer: Self-pay | Admitting: *Deleted

## 2012-09-27 DIAGNOSIS — I214 Non-ST elevation (NSTEMI) myocardial infarction: Secondary | ICD-10-CM

## 2012-09-27 DIAGNOSIS — R739 Hyperglycemia, unspecified: Secondary | ICD-10-CM | POA: Diagnosis present

## 2012-09-27 DIAGNOSIS — E785 Hyperlipidemia, unspecified: Secondary | ICD-10-CM | POA: Diagnosis present

## 2012-09-27 DIAGNOSIS — E1169 Type 2 diabetes mellitus with other specified complication: Secondary | ICD-10-CM | POA: Diagnosis present

## 2012-09-27 HISTORY — DX: Hyperglycemia, unspecified: R73.9

## 2012-09-27 HISTORY — PX: TRANSTHORACIC ECHOCARDIOGRAM: SHX275

## 2012-09-27 HISTORY — DX: Non-ST elevation (NSTEMI) myocardial infarction: I21.4

## 2012-09-27 HISTORY — DX: Hyperlipidemia, unspecified: E78.5

## 2012-09-27 LAB — LIPID PANEL
HDL: 46 mg/dL (ref >39)
LDL Cholesterol: 166 mg/dL — ABNORMAL HIGH (ref 0–99)
Total CHOL/HDL Ratio: 5.4 RATIO
Triglycerides: 181 mg/dL — ABNORMAL HIGH (ref <150)

## 2012-09-27 LAB — GLUCOSE, CAPILLARY
Glucose-Capillary: 219 mg/dL — ABNORMAL HIGH (ref 70–99)
Glucose-Capillary: 262 mg/dL — ABNORMAL HIGH (ref 70–99)
Glucose-Capillary: 244 mg/dL — ABNORMAL HIGH (ref 70–99)
Glucose-Capillary: 228 mg/dL — ABNORMAL HIGH (ref 70–99)

## 2012-09-27 LAB — HEPARIN LEVEL (UNFRACTIONATED)
Heparin Unfractionated: <0.1 IU/mL — ABNORMAL LOW (ref 0.30–0.70)
Heparin Unfractionated: 0.36 IU/mL (ref 0.30–0.70)
Heparin Unfractionated: 0.33 IU/mL (ref 0.30–0.70)

## 2012-09-27 LAB — COMPREHENSIVE METABOLIC PANEL
ALT: 32 U/L (ref 0–53)
AST: 51 U/L — ABNORMAL HIGH (ref 0–37)
Albumin: 3.4 g/dL — ABNORMAL LOW (ref 3.5–5.2)
CO2: 26 mEq/L (ref 19–32)
Chloride: 101 mEq/L (ref 96–112)
GFR calc non Af Amer: >90 mL/min (ref >90)
Potassium: 4.4 mEq/L (ref 3.5–5.1)
Sodium: 137 mEq/L (ref 135–145)
Total Bilirubin: 0.7 mg/dL (ref 0.3–1.2)

## 2012-09-27 LAB — URINALYSIS, ROUTINE W REFLEX MICROSCOPIC
Bilirubin Urine: NEGATIVE
Glucose, UA: >1000 mg/dL — AB
Ketones, ur: 15 mg/dL — AB
Leukocytes, UA: NEGATIVE
Nitrite: NEGATIVE
Protein, ur: 30 mg/dL — AB
Specific Gravity, Urine: 1.025 (ref 1.005–1.030)
Urobilinogen, UA: 1 mg/dL (ref 0.0–1.0)
pH: 7 (ref 5.0–8.0)

## 2012-09-27 LAB — BASIC METABOLIC PANEL
CO2: 25 mEq/L (ref 19–32)
Calcium: 8.5 mg/dL (ref 8.4–10.5)
Creatinine, Ser: 0.78 mg/dL (ref 0.50–1.35)
GFR calc non Af Amer: >90 mL/min (ref >90)
Glucose, Bld: 264 mg/dL — ABNORMAL HIGH (ref 70–99)
Sodium: 137 mEq/L (ref 135–145)

## 2012-09-27 LAB — URINE MICROSCOPIC-ADD ON

## 2012-09-27 LAB — CBC
MCH: 30 pg (ref 26.0–34.0)
MCV: 84.9 fL (ref 78.0–100.0)
Platelets: 256 10*3/uL (ref 150–400)
RBC: 4.96 MIL/uL (ref 4.22–5.81)
RDW: 12.5 % (ref 11.5–15.5)

## 2012-09-27 LAB — APTT: aPTT: 38 seconds — ABNORMAL HIGH (ref 24–37)

## 2012-09-27 LAB — TSH: TSH: 0.473 u[IU]/mL (ref 0.350–4.500)

## 2012-09-27 LAB — HEMOGLOBIN A1C: Hgb A1c MFr Bld: 11.3 % — ABNORMAL HIGH (ref <5.7)

## 2012-09-27 LAB — MAGNESIUM: Magnesium: 1.9 mg/dL (ref 1.5–2.5)

## 2012-09-27 MED ORDER — INSULIN ASPART 100 UNIT/ML ~~LOC~~ SOLN
0.0000 [IU] | Freq: Every day | SUBCUTANEOUS | Status: DC
  Administered 2012-09-27 – 2012-09-29 (×3): 2 [IU] via SUBCUTANEOUS

## 2012-09-27 MED ORDER — EPTIFIBATIDE BOLUS VIA INFUSION
22.5000 mg | Freq: Once | INTRAVENOUS | Status: AC
  Administered 2012-09-27: 22.5 mg via INTRAVENOUS
  Filled 2012-09-27: qty 30

## 2012-09-27 MED ORDER — PANTOPRAZOLE SODIUM 40 MG PO TBEC
40.0000 mg | DELAYED_RELEASE_TABLET | Freq: Every day | ORAL | Status: DC
  Administered 2012-09-27 – 2012-09-30 (×4): 40 mg via ORAL
  Filled 2012-09-27 (×4): qty 1

## 2012-09-27 MED ORDER — METOPROLOL TARTRATE 25 MG PO TABS
25.0000 mg | ORAL_TABLET | Freq: Two times a day (BID) | ORAL | Status: DC
  Administered 2012-09-27 – 2012-09-29 (×6): 25 mg via ORAL
  Filled 2012-09-27 (×8): qty 1

## 2012-09-27 MED ORDER — SODIUM CHLORIDE 0.9 % IV SOLN: 1.0000 mL/kg/h | INTRAVENOUS | Status: DC

## 2012-09-27 MED ORDER — ALPRAZOLAM 0.25 MG PO TABS: 0.2500 mg | ORAL_TABLET | Freq: Three times a day (TID) | ORAL | Status: DC | PRN

## 2012-09-27 MED ORDER — HEPARIN BOLUS VIA INFUSION
4000.0000 [IU] | Freq: Once | INTRAVENOUS | Status: AC
  Administered 2012-09-27: 4000 [IU] via INTRAVENOUS
  Filled 2012-09-27: qty 4000

## 2012-09-27 MED ORDER — EPTIFIBATIDE 75 MG/100ML IV SOLN
15.0000 mg/h | INTRAVENOUS | Status: DC
  Administered 2012-09-27 – 2012-09-28 (×4): 15 mg/h via INTRAVENOUS
  Filled 2012-09-27 (×10): qty 100

## 2012-09-27 MED ORDER — ZOLPIDEM TARTRATE 5 MG PO TABS: 10.0000 mg | ORAL_TABLET | Freq: Every evening | ORAL | Status: DC | PRN

## 2012-09-27 MED ORDER — INSULIN ASPART 100 UNIT/ML ~~LOC~~ SOLN
0.0000 [IU] | Freq: Three times a day (TID) | SUBCUTANEOUS | Status: DC
  Administered 2012-09-27: 5 [IU] via SUBCUTANEOUS
  Administered 2012-09-27 – 2012-09-28 (×2): 3 [IU] via SUBCUTANEOUS
  Administered 2012-09-28: 2 [IU] via SUBCUTANEOUS
  Administered 2012-09-29 (×2): 3 [IU] via SUBCUTANEOUS
  Administered 2012-09-29 – 2012-09-30 (×3): 2 [IU] via SUBCUTANEOUS

## 2012-09-27 MED ORDER — DIAZEPAM 5 MG PO TABS
5.0000 mg | ORAL_TABLET | ORAL | Status: AC
  Administered 2012-09-28: 5 mg via ORAL
  Filled 2012-09-27: qty 1

## 2012-09-27 NOTE — Progress Notes (Signed)
ANTICOAGULATION CONSULT NOTE - Initial Consult  Pharmacy Consult for Heparin and Integrillin Indication: NSTEMI  No Known Allergies  Patient Measurements: Height: 5\' 8"  (172.7 cm) Weight: 293 lb 14 oz (133.3 kg) IBW/kg (Calculated) : 68.4 Heparin Dosing Weight: 99.8kg  Vital Signs: Temp: 98.8 F (37.1 C) (05/11 0400) Temp src: Oral (05/11 0400) BP: 125/77 mmHg (05/11 1300) Pulse Rate: 78 (05/11 1300)  Labs:  Recent Labs  09/26/12 1544 09/27/12 0028 09/27/12 0412 09/27/12 1225  HGB 15.6  --  14.9  --   HCT 43.9  --  42.1  --   PLT 249  --  256  --   APTT 26 38*  --   --   LABPROT 13.2 13.5  --   --   INR 1.01 1.04  --   --   HEPARINUNFRC  --   --  <0.10* 0.36  CREATININE 0.95 0.89 0.78  --   TROPONINI  --  10.55* >20.00*  --     Estimated Creatinine Clearance: 155.7 ml/min (by C-G formula based on Cr of 0.78).   Medical History: Past Medical History  Diagnosis Date  . Kidney stone     Medications:  Scheduled:  . aspirin EC  81 mg Oral Daily  . atorvastatin  80 mg Oral q1800  . [COMPLETED] eptifibatide  22.5 mg Intravenous Once  . [COMPLETED] gi cocktail  30 mL Oral Once  . [COMPLETED] heparin  4,000 Units Intravenous Once  . [COMPLETED] heparin  4,000 Units Intravenous Once  . insulin aspart  0-5 Units Subcutaneous QHS  . insulin aspart  0-9 Units Subcutaneous TID WC  . metoprolol tartrate  25 mg Oral BID  . [COMPLETED] nitroGLYCERIN  2-200 mcg/min Intravenous Once  . pantoprazole  40 mg Oral Q1200  . tamsulosin  0.4 mg Oral QPC supper  . [DISCONTINUED] heparin  4,000 Units Intravenous Once  . [DISCONTINUED] insulin aspart  0-15 Units Subcutaneous Q4H  . [DISCONTINUED] metoprolol tartrate  12.5 mg Oral BID    Assessment: 47 yr old male on heparin for NSTEMI, troponin levels peaked to > 20 today therefore consulted to initiate single bolus integrillin with infusion. HL  0.36 therapeutic. Cath planned for Monday AM. CBC wnl, PLT 256 stable.  Goal of  Therapy:  Heparin level 0.3-0.5 (on 2b/3a inhibitor)   Plan:  1. Continue heparin at current gtt rate 1250 units/hour 2. 8 hour heparin level to confirm 3. Initiate integrillin bolus 22.5 mg x 1, then 15mg /hr integrillin infusion 4. Monitor daily heparin level/cbc 5. Monitor platelets on integrillin   Bola A. Wandra Feinstein D Clinical Pharmacist Pager:[phone] Phone [phone] 09/27/2012 2:37 PM

## 2012-09-27 NOTE — Progress Notes (Signed)
ANTICOAGULATION CONSULT NOTE - Initial Consult  Pharmacy Consult for Heparin Indication: NSTEMI  No Known Allergies  Patient Measurements: Height: 5\' 8"  (172.7 cm) Weight: 293 lb 14 oz (133.3 kg) IBW/kg (Calculated) : 68.4 Heparin Dosing Weight: 99.8kg  Vital Signs: Temp: 98.8 F (37.1 C) (05/11 0400) Temp src: Oral (05/11 0400) BP: 140/84 mmHg (05/11 0400) Pulse Rate: 83 (05/11 0400)  Labs:  Recent Labs  09/26/12 1544 09/27/12 0028 09/27/12 0412  HGB 15.6  --  14.9  HCT 43.9  --  42.1  PLT 249  --  256  APTT 26 38*  --   LABPROT 13.2 13.5  --   INR 1.01 1.04  --   HEPARINUNFRC  --   --  <0.10*  CREATININE 0.95 0.89  --   TROPONINI  --  10.55*  --     Estimated Creatinine Clearance: 140 ml/min (by C-G formula based on Cr of 0.89).   Medical History: Past Medical History  Diagnosis Date  . Kidney stone     Medications:  Scheduled:  . aspirin EC  81 mg Oral Daily  . atorvastatin  80 mg Oral q1800  . [COMPLETED] gi cocktail  30 mL Oral Once  . [COMPLETED] heparin  4,000 Units Intravenous Once  . insulin aspart  0-15 Units Subcutaneous Q4H  . metoprolol tartrate  12.5 mg Oral BID  . [COMPLETED] nitroGLYCERIN  2-200 mcg/min Intravenous Once  . tamsulosin  0.4 mg Oral QPC supper  . [DISCONTINUED] heparin  4,000 Units Intravenous Once    Assessment: 47 yr old male on heparin for NSTEMI heparin level undectable. Pt weight 133kg.   Goal of Therapy:  Heparin level 0.3-0.7 units/ml Monitor platelets by anticoagulation protocol: Yes   Plan:   rebolus heparin 4000 units and increase to 1850 units/hr. Recheck Heparin level in 6 hours ~ noon   Janice Coffin

## 2012-09-27 NOTE — Progress Notes (Signed)
Subjective:  Some chest pain overnight, none now  Objective:  Vital Signs in the last 24 hours: Temp:  [98 F (36.7 C)-98.8 F (37.1 C)] 98.8 F (37.1 C) (05/11 0400) Pulse Rate:  [65-105] 91 (05/11 0800) Resp:  [13-24] 22 (05/11 0800) BP: (115-143)/(70-107) 129/85 mmHg (05/11 0800) SpO2:  [94 %-100 %] 99 % (05/11 0800) Weight:  [132.904 kg (293 lb)-136.079 kg (300 lb)] 133.3 kg (293 lb 14 oz) (05/10 2331)  Intake/Output from previous day:  Intake/Output Summary (Last 24 hours) at 09/27/12 0955 Last data filed at 09/27/12 0800  Gross per 24 hour  Intake 274.07 ml  Output    700 ml  Net -425.93 ml    Physical Exam: General appearance: alert, cooperative, no distress and morbidly obese Lungs: clear to auscultation bilaterally Heart: regular rate and rhythm, S1, S2 normal, no murmur, click, rub or gallop Abd:  Moderate to morbidly obese, soft/NT/ND/NABS Extr: no c/c/e; 2 + pulses  Neuro: CN II-XII grossly intact.  Rate: 88  Rhythm: normal sinus rhythm  Lab Results:  Recent Labs  09/26/12 1544 09/27/12 0412  WBC 12.0* 12.3*  HGB 15.6 14.9  PLT 249 256    Recent Labs  09/27/12 0028 09/27/12 0412  NA 137 137  K 4.4 4.0  CL 101 102  CO2 26 25  GLUCOSE 311* 264*  BUN 9 8  CREATININE 0.89 0.78    Recent Labs  09/27/12 0028 09/27/12 0412  TROPONINI 10.55* >20.00*   Hepatic Function Panel  Recent Labs  09/27/12 0028  PROT 6.7  ALBUMIN 3.4*  AST 51*  ALT 32  ALKPHOS 104  BILITOT 0.7    Recent Labs  09/27/12 0412  CHOL 248*    Recent Labs  09/27/12 0028  INR 1.04    Imaging: Imaging results have been reviewed  Cardiac Studies:  Assessment/Plan:  Principal Problem:   NSTEMI (non-ST elevated myocardial infarction) - possible Missed STEMI Active Problems:   Obesity, unspecified   Dyslipidemia   Hyperglycemia   Kidney stone    PLAN: Cath in am. Increase beta blocker. Echo ordered. Check HGb A1c.  Corine Shelter PA-C Beeper  [phone] 09/27/2012, 9:55 AM  Patient was admitted overnight by Dr. Tresa Endo (Fellow on call) for NSTEMI.  I agree with his initial plan.   I am currently seeing him this AM along with Mr. Diona Fanti, [place].  I agree with his findings & plan.  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 Troponin levels have increased dramatically to >20, which is concerning for a relatively large infarct -- will review Echo to guide therapy.  Currently CP free.  ECG pending & Echo being done.  Continue IV Heparin.  BP & HR will tolerate increased dose of BB. -- plan LHC +/- PCI in AM.  Will not dose with second oral antiplatelet agent at this time, due to concern for possible Multivessel disease, but will start Integrilin based upon the level of troponin elevation.  If he has recurrent Angina, would consider emergent cardiac cath today/overnight.  Statin.    Cover with SSI for now -- will need to figure out a home regimen (meformin cannot be started until 48 hr post cath).   The Cardiac Catheterization +/- PCI procedure with Risks/Benefits/Alternatives and Indications was reviewed with the patient.  All questions were answered.    Risks / Complications include, but not limited to: Death, MI, CVA/TIA, VF/VT (with defibrillation),  Bradycardia (need for temporary pacer placement), contrast induced nephropathy, bleeding / bruising / hematoma / pseudoaneurysm, vascular or coronary injury (with possible emergent CT or Vascular Surgery), adverse medication reactions, infection.    The patient voices understanding and agree to proceed.    Marykay Lex, M.D., M.S. [facility] [address] [place], [place]  [address]  [phone] Pager # [phone] 09/27/2012 9:58 AM

## 2012-09-27 NOTE — Progress Notes (Signed)
ANTICOAGULATION CONSULT NOTE - Initial Consult  Pharmacy Consult for Heparin and Integrillin Indication: NSTEMI  No Known Allergies  Patient Measurements: Height: 5\' 8"  (172.7 cm) Weight: 293 lb 14 oz (133.3 kg) IBW/kg (Calculated) : 68.4 Heparin Dosing Weight: 99.8kg  Vital Signs: Temp: 98.3 F (36.8 C) (05/11 2000) Temp src: Oral (05/11 2000) BP: 139/81 mmHg (05/11 2000) Pulse Rate: 91 (05/11 1800)  Labs:  Recent Labs  09/26/12 1544 09/27/12 0028 09/27/12 0412 09/27/12 1225 09/27/12 2020  HGB 15.6  --  14.9  --   --   HCT 43.9  --  42.1  --   --   PLT 249  --  256  --   --   APTT 26 38*  --   --   --   LABPROT 13.2 13.5  --   --   --   INR 1.01 1.04  --   --   --   HEPARINUNFRC  --   --  <0.10* 0.36 0.33  CREATININE 0.95 0.89 0.78  --   --   TROPONINI  --  10.55* >20.00*  --   --     Estimated Creatinine Clearance: 155.7 ml/min (by C-G formula based on Cr of 0.78).   Medical History: Past Medical History  Diagnosis Date  . Kidney stone     Medications:  Scheduled:  . aspirin EC  81 mg Oral Daily  . atorvastatin  80 mg Oral q1800  . [START ON 09/28/2012] diazepam  5 mg Oral On Call  . [COMPLETED] eptifibatide  22.5 mg Intravenous Once  . [COMPLETED] heparin  4,000 Units Intravenous Once  . insulin aspart  0-5 Units Subcutaneous QHS  . insulin aspart  0-9 Units Subcutaneous TID WC  . metoprolol tartrate  25 mg Oral BID  . pantoprazole  40 mg Oral Q1200  . tamsulosin  0.4 mg Oral QPC supper  . [DISCONTINUED] insulin aspart  0-15 Units Subcutaneous Q4H  . [DISCONTINUED] metoprolol tartrate  12.5 mg Oral BID    Assessment: 47 yr old male on heparin for NSTEMI, troponin levels peaked to > 20 today therefore consulted to initiate single bolus integrillin with infusion. HL at goal.  Cath planned for Monday AM. CBC wnl, PLT 256 stable.  Goal of Therapy:  Heparin level 0.3-0.5 (on 2b/3a inhibitor)   Plan:  Continue heparin at current gtt rate 1250  units/hour Daily heparin level/cbc Monitor platelets on integrillin   Wendie Simmer, PharmD, BCPS Clinical Pharmacist  Pager: [phone]

## 2012-09-27 NOTE — Progress Notes (Signed)
  Echocardiogram 2D Echocardiogram has been performed.  [place] 09/27/2012, 9:54 AM

## 2012-09-28 ENCOUNTER — Encounter (HOSPITAL_COMMUNITY)
Admission: EM | Disposition: A | Discharge status: Home / Self Care | Payer: Self-pay | Source: Home / Self Care | Attending: Cardiology

## 2012-09-28 HISTORY — PX: PERCUTANEOUS CORONARY STENT INTERVENTION (PCI-S): SHX5485

## 2012-09-28 HISTORY — PX: LEFT HEART CATHETERIZATION WITH CORONARY ANGIOGRAM: SHX5451

## 2012-09-28 LAB — POCT ACTIVATED CLOTTING TIME
Activated Clotting Time: 269 seconds
Activated Clotting Time: 318 seconds

## 2012-09-28 LAB — BASIC METABOLIC PANEL
BUN: 5 mg/dL — ABNORMAL LOW (ref 6–23)
CO2: 26 mEq/L (ref 19–32)
Calcium: 8.6 mg/dL (ref 8.4–10.5)
Chloride: 100 mEq/L (ref 96–112)
Creatinine, Ser: 0.8 mg/dL (ref 0.50–1.35)
GFR calc Af Amer: >90 mL/min (ref >90)
GFR calc non Af Amer: >90 mL/min (ref >90)
Glucose, Bld: 231 mg/dL — ABNORMAL HIGH (ref 70–99)
Potassium: 4.1 mEq/L (ref 3.5–5.1)
Sodium: 133 mEq/L — ABNORMAL LOW (ref 135–145)

## 2012-09-28 LAB — CBC
HCT: 41.8 % (ref 39.0–52.0)
Hemoglobin: 14.4 g/dL (ref 13.0–17.0)
MCH: 29.5 pg (ref 26.0–34.0)
MCHC: 34.4 g/dL (ref 30.0–36.0)
MCV: 85.7 fL (ref 78.0–100.0)
Platelets: 234 10*3/uL (ref 150–400)
RBC: 4.88 MIL/uL (ref 4.22–5.81)
RDW: 12.6 % (ref 11.5–15.5)
WBC: 10.1 10*3/uL (ref 4.0–10.5)

## 2012-09-28 LAB — HEPARIN LEVEL (UNFRACTIONATED): Heparin Unfractionated: 0.3 IU/mL (ref 0.30–0.70)

## 2012-09-28 LAB — PROTIME-INR
INR: 1.05 (ref 0.00–1.49)
Prothrombin Time: 13.6 seconds (ref 11.6–15.2)

## 2012-09-28 LAB — GLUCOSE, CAPILLARY: Glucose-Capillary: 220 mg/dL — ABNORMAL HIGH (ref 70–99)

## 2012-09-28 SURGERY — LEFT HEART CATHETERIZATION WITH CORONARY ANGIOGRAM
Anesthesia: LOCAL

## 2012-09-28 MED ORDER — FAMOTIDINE IN NACL 20-0.9 MG/50ML-% IV SOLN
INTRAVENOUS | Status: AC
  Filled 2012-09-28: qty 50

## 2012-09-28 MED ORDER — HEPARIN SODIUM (PORCINE) 1000 UNIT/ML IJ SOLN
INTRAMUSCULAR | Status: AC
  Filled 2012-09-28: qty 1

## 2012-09-28 MED ORDER — LIVING WELL WITH DIABETES BOOK
Freq: Once | Status: DC
  Filled 2012-09-28: qty 1

## 2012-09-28 MED ORDER — SODIUM CHLORIDE 0.9 % IV SOLN: 1.0000 mL/kg/h | INTRAVENOUS | Status: AC

## 2012-09-28 MED ORDER — NITROGLYCERIN IN D5W 200-5 MCG/ML-% IV SOLN: 2.0000 ug/min | INTRAVENOUS | Status: DC

## 2012-09-28 MED ORDER — HEPARIN (PORCINE) IN NACL 2-0.9 UNIT/ML-% IJ SOLN
INTRAMUSCULAR | Status: AC
  Filled 2012-09-28: qty 1000

## 2012-09-28 MED ORDER — MORPHINE SULFATE 2 MG/ML IJ SOLN: 1.0000 mg | INTRAMUSCULAR | Status: DC | PRN

## 2012-09-28 MED ORDER — VERAPAMIL HCL 2.5 MG/ML IV SOLN
INTRAVENOUS | Status: AC
  Filled 2012-09-28: qty 2

## 2012-09-28 MED ORDER — PRASUGREL HCL 10 MG PO TABS
10.0000 mg | ORAL_TABLET | Freq: Every day | ORAL | Status: DC
  Administered 2012-09-29 – 2012-09-30 (×2): 10 mg via ORAL
  Filled 2012-09-28 (×2): qty 1

## 2012-09-28 MED ORDER — NITROGLYCERIN 1 MG/10 ML FOR IR/CATH LAB
INTRA_ARTERIAL | Status: AC
  Filled 2012-09-28: qty 10

## 2012-09-28 MED ORDER — LIDOCAINE HCL (PF) 1 % IJ SOLN
INTRAMUSCULAR | Status: AC
  Filled 2012-09-28: qty 30

## 2012-09-28 MED ORDER — EPTIFIBATIDE 75 MG/100ML IV SOLN
1.0000 ug/kg/min | INTRAVENOUS | Status: AC
  Administered 2012-09-28: 1 ug/kg/min via INTRAVENOUS

## 2012-09-28 MED ORDER — LISINOPRIL 2.5 MG PO TABS
2.5000 mg | ORAL_TABLET | Freq: Every day | ORAL | Status: DC
  Administered 2012-09-28 – 2012-09-30 (×3): 2.5 mg via ORAL
  Filled 2012-09-28 (×3): qty 1

## 2012-09-28 MED ORDER — PRASUGREL HCL 10 MG PO TABS
ORAL_TABLET | ORAL | Status: AC
  Filled 2012-09-28: qty 6

## 2012-09-28 MED ORDER — ASPIRIN 81 MG PO CHEW
CHEWABLE_TABLET | ORAL | Status: AC
  Filled 2012-09-28: qty 4

## 2012-09-28 MED ORDER — MIDAZOLAM HCL 2 MG/2ML IJ SOLN
INTRAMUSCULAR | Status: AC
  Filled 2012-09-28: qty 2

## 2012-09-28 MED ORDER — FENTANYL CITRATE 0.05 MG/ML IJ SOLN
INTRAMUSCULAR | Status: AC
  Filled 2012-09-28: qty 2

## 2012-09-28 NOTE — Care Management Note (Signed)
    Page 1 of 1   09/28/2012     11:43:07 AM   CARE MANAGEMENT NOTE 09/28/2012  Patient:  Thomas Beck, Thomas Beck   Account Number:  [id number]  Date Initiated:  09/28/2012  Documentation initiated by:  Junius Creamer  Subjective/Objective Assessment:   adm w mi     Action/Plan:   lives w wife   Anticipated DC Date:     Anticipated DC Plan:        DC Planning Services  CM consult  Medication Assistance      Choice offered to / List presented to:             Status of service:   Medicare Important Message given?   (If response is "NO", the following Medicare IM given date fields will be blank) Date Medicare IM given:   Date Additional Medicare IM given:    Discharge Disposition:    Per UR Regulation:  Reviewed for med. necessity/level of care/duration of stay  If discussed at Long Length of Stay Meetings, dates discussed:    Comments:  5/12 1138 debbie Thomas Buist rn, bsn getting cm sec to ck on copay for effient.left effient 30day free and copay assist card in room.

## 2012-09-28 NOTE — Progress Notes (Addendum)
Inpatient Diabetes Program Recommendations  AACE/ADA: New Consensus Statement on Inpatient Glycemic Control (2013)  Target Ranges:  Prepandial:   less than 140 mg/dL      Peak postprandial:   less than 180 mg/dL (1-2 hours)      Critically ill patients:  140 - 180 mg/dL   Pt with dx of "hyperglycemia". HgbA1C 11.3%  This is definitive of diagnosis of DM.  Please add DM to active in-hospital problem list. However HgbA1C is very much influenced by glucose values of the past 2 weeks by as much as 1.5%. While here: Would use Lantus while here-start with 15 units Lantus Increase correction to moderate tidwc.  For discharge, would consider Metformin 500 mg bid and Amaryl 4 mg qd. Primary MD can follow and make further changes. Thank you, Lenor Coffin, RN, CNS, Diabetes Coordinator [phone])

## 2012-09-28 NOTE — Brief Op Note (Signed)
09/26/2012 - 09/28/2012  11:01 AM  PATIENT:  Thomas Beck  47 y.o. male (moderate to morbidly obese) with no prior PMH who presented with a significant NSTEMI.  Found to be hypertensive, with dyslipidemia & likely new Dx of DM-2. Troponin level >20 with suggestion of LAD distribution ischemic changes on ECG.  Referred for cardiac catheterization.  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   Moderate mid to apical anterior hypokinesis - EF ~35-40% by LV Gram  Hemodynamics:   LVP: 105/20 mmHg - 24 mmHg EDP  AoO: 109/85 mmHg; MAP 97 mmHg  PROCEDURE:  Procedure(s): LEFT HEART CATHETERIZATION WITH CORONARY ANGIOGRAM (N/A) PERCUTANEOUS CORONARY INTERVENTION OF MID LAD 100% OCCLUSION  SURGEON:  Surgeon(s) and Role:    * Marykay Lex, MD - Primary  ANESTHESIA:   local and IV sedation -- 18 ml Lidocaine; 2mg  Versed, 50 mcg Fentanyl  EBL:  < 20 ml  PROCEDURE: Attempted - aborted R Radial A access -- fluroscopically guided modified Seldinger's RFA - 5 Fr sheath; JL4 - LCA, JR4-RAC, Angled Pigtail - LV Gram & hemodynamics   PCI:  echanged to 6 Fr sheat  Guide: 6Fr XB LAD - Guidewire: [place]  Predilation Emerge 2.5 mm x 12 mm  Stent: Promus Premier 3.0 mm x28 mm  Post-dilation: [place] 3.25 mm x 15 mm Post PCI Angiography reveal excellent stent deployment with lesion coverage, TIMI 3 flow in LAD & "jailed" D1.  No dissection or perforation.  Patient was stable pre-, during & post-procedure. No Complications.  MEDICATIONS USED:  IV Heparin 6500 units, Prasugrel 60 mg PO, Pepcid 20mg  IV; Integrilin gtt; Omnipaque contrast 190  ml.  SHEATH:  6 Fr RFA sewn into place.  DICTATION: .Note written in EPIC; see inflation chart for details.  PLAN OF CARE: Admit to inpatient   PATIENT DISPOSITION:  ICU - extubated and stable.   Delay start of Pharmacological VTE agent (>24hrs) due to surgical blood loss or risk of bleeding: no  Baileigh Modisette W, M.D., M.S. [facility] [address] [place], [place]  [address]  [phone] Pager # [phone] 09/28/2012 11:13 AM

## 2012-09-28 NOTE — Progress Notes (Signed)
Right femoral arterial sheath removed without difficulty. Held pressure for 20 min. No hematoma,postive pedal pulse before and after sheath removal.Pt. Instructed in precautions.

## 2012-09-28 NOTE — Interval H&P Note (Signed)
History and Physical Interval Note:  09/28/2012 8:53 AM  Thomas Beck  has presented today for Cardiac Catheterizaion, with the diagnosis of NSTEMI.  The various methods of treatment have been discussed with the patient and family. After consideration of risks, benefits and other options for treatment, the patient has consented to  Procedure(s): LEFT HEART CATHETERIZATION WITH CORONARY ANGIOGRAM (N/A) as a surgical intervention .    The patient's history has been reviewed, patient examined, no change in status, stable for cardiac cath.  No additional symptoms overnight.  I have reviewed the patient's chart and labs.  Questions were answered to the patient's satisfaction.     Lexis Potenza W

## 2012-09-28 NOTE — Progress Notes (Signed)
Began ed with pt and wife re MI and lifestyle change. Gave diets and encouraged watching DM videos today. Pt and wife motivated to change. Will f/u with more education and ambulation tomorrow. [phone] Ethelda Chick CES, ACSM 8:44 AM 09/28/2012

## 2012-09-28 NOTE — Progress Notes (Signed)
ANTICOAGULATION CONSULT NOTE - Initial Consult  Pharmacy Consult for Heparin and Integrilin Indication: NSTEMI  No Known Allergies  Patient Measurements: Height: 5\' 8"  (172.7 cm) Weight: 287 lb 11.2 oz (130.5 kg) IBW/kg (Calculated) : 68.4 Heparin Dosing Weight: 99.8kg  Vital Signs: Temp: 98.5 F (36.9 C) (05/12 0745) Temp src: Oral (05/12 0745) BP: 120/81 mmHg (05/12 0800) Pulse Rate: 84 (05/12 0000)  Labs:  Recent Labs  09/26/12 1544 09/27/12 0028  09/27/12 0412 09/27/12 1225 09/27/12 2020 09/28/12 0430 09/28/12 0432  HGB 15.6  --   --  14.9  --   --  14.4  --   HCT 43.9  --   --  42.1  --   --  41.8  --   PLT 249  --   --  256  --   --  234  --   APTT 26 38*  --   --   --   --   --   --   LABPROT 13.2 13.5  --   --   --   --  13.6  --   INR 1.01 1.04  --   --   --   --  1.05  --   HEPARINUNFRC  --   --   < > <0.10* 0.36 0.33  --  0.30  CREATININE 0.95 0.89  --  0.78  --   --  0.80  --   TROPONINI  --  10.55*  --  >20.00*  --   --   --   --   < > = values in this interval not displayed.  Estimated Creatinine Clearance: 153.7 ml/min (by C-G formula based on Cr of 0.8).  Medications: Heparin @ 1850 units/hr Integrilin @ 15mg /hr  Assessment: 47yom continues on on heparin and integrilin for NSTEMI. Heparin level is at goal. CBC stable. No bleeding reported. For cath today.  Goal of Therapy:  Heparin level 0.3-0.5 (on 2b/3a inhibitor) Monitor platelets by anticoagulation protocol: Yes   Plan:  1) Continue heparin at 1850 units/hr 2) Continue integrilin at 15mg /hr 3) Follow up after cath  Louie Casa, PharmD, BCPS 09/28/12, 8:38 AM

## 2012-09-28 NOTE — H&P (View-Only) (Signed)
Subjective:  Some chest pain overnight, none now  Objective:  Vital Signs in the last 24 hours: Temp:  [98 F (36.7 C)-98.8 F (37.1 C)] 98.8 F (37.1 C) (05/11 0400) Pulse Rate:  [65-105] 91 (05/11 0800) Resp:  [13-24] 22 (05/11 0800) BP: (115-143)/(70-107) 129/85 mmHg (05/11 0800) SpO2:  [94 %-100 %] 99 % (05/11 0800) Weight:  [132.904 kg (293 lb)-136.079 kg (300 lb)] 133.3 kg (293 lb 14 oz) (05/10 2331)  Intake/Output from previous day:  Intake/Output Summary (Last 24 hours) at 09/27/12 0955 Last data filed at 09/27/12 0800  Gross per 24 hour  Intake 274.07 ml  Output    700 ml  Net -425.93 ml    Physical Exam: General appearance: alert, cooperative, no distress and morbidly obese Lungs: clear to auscultation bilaterally Heart: regular rate and rhythm, S1, S2 normal, no murmur, click, rub or gallop Abd:  Moderate to morbidly obese, soft/NT/ND/NABS Extr: no c/c/e; 2 + pulses  Neuro: CN II-XII grossly intact.  Rate: 88  Rhythm: normal sinus rhythm  Lab Results:  Recent Labs  09/26/12 1544 09/27/12 0412  WBC 12.0* 12.3*  HGB 15.6 14.9  PLT 249 256    Recent Labs  09/27/12 0028 09/27/12 0412  NA 137 137  K 4.4 4.0  CL 101 102  CO2 26 25  GLUCOSE 311* 264*  BUN 9 8  CREATININE 0.89 0.78    Recent Labs  09/27/12 0028 09/27/12 0412  TROPONINI 10.55* >20.00*   Hepatic Function Panel  Recent Labs  09/27/12 0028  PROT 6.7  ALBUMIN 3.4*  AST 51*  ALT 32  ALKPHOS 104  BILITOT 0.7    Recent Labs  09/27/12 0412  CHOL 248*    Recent Labs  09/27/12 0028  INR 1.04    Imaging: Imaging results have been reviewed  Cardiac Studies:  Assessment/Plan:  Principal Problem:   NSTEMI (non-ST elevated myocardial infarction) - possible Missed STEMI Active Problems:   Obesity, unspecified   Dyslipidemia   Hyperglycemia   Kidney stone    PLAN: Cath in am. Increase beta blocker. Echo ordered. Check HGb A1c.  Thomas Kilroy PA-C Beeper  [phone] 09/27/2012, 9:55 AM  Patient was admitted overnight by Dr. Kelly (Fellow on call) for NSTEMI.  I agree with his initial plan.   I am currently seeing him this AM along with Mr. Kilroy, PA.  I agree with his findings & plan.  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 Troponin levels have increased dramatically to >20, which is concerning for a relatively large infarct -- will review Echo to guide therapy.  Currently CP free.  ECG pending & Echo being done.  Continue IV Heparin.  BP & HR will tolerate increased dose of BB. -- plan LHC +/- PCI in AM.  Will not dose with second oral antiplatelet agent at this time, due to concern for possible Multivessel disease, but will start Integrilin based upon the level of troponin elevation.  If he has recurrent Angina, would consider emergent cardiac cath today/overnight.  Statin.    Cover with SSI for now -- will need to figure out a home regimen (meformin cannot be started until 48 hr post cath).   The Cardiac Catheterization +/- PCI procedure with Risks/Benefits/Alternatives and Indications was reviewed with the patient.  All questions were answered.    Risks / Complications include, but not limited to: Death, MI, CVA/TIA, VF/VT (with defibrillation),   Bradycardia (need for temporary pacer placement), contrast induced nephropathy, bleeding / bruising / hematoma / pseudoaneurysm, vascular or coronary injury (with possible emergent CT or Vascular Surgery), adverse medication reactions, infection.    The patient voices understanding and agree to proceed.    Thomas Beck, M.D., M.S. [facility] [address] [place], [place]  [address]  [phone] Pager # [phone] 09/27/2012 9:58 AM       

## 2012-09-28 NOTE — CV Procedure (Signed)
[facility] CARDIAC CATHETERIZATION AND PERCUTANEOUS CORONARY INTERVENTION REPORT  NAME:  Jabree Rebert   MRN: [id number] DOB:  06/19/1965   ADMIT DATE: 09/26/2012 Procedure Date: 09/28/2012  INTERVENTIONAL CARDIOLOGIST: Marykay Lex, M.D., MS PRIMARY CARE PROVIDER: Pcp Not In System PRIMARY CARDIOLOGIST: Marykay Lex, M.D., MS  PATIENT:  Thomas Beck 47 y.o. male (moderate to morbidly obese) with no prior PMH who presented with a significant NSTEMI. Found to be hypertensive, with dyslipidemia & likely new Dx of DM-2. Troponin level >20 with suggestion of LAD distribution ischemic changes on ECG. Referred for cardiac catheterization.   Due to the significant level of Troponin elevation, he was treated overnight with IV Heparin and Nitroglycerin as well as IV Integrelin.  PRE-OPERATIVE DIAGNOSIS:    NSTEMI (with significant Troponin elevatoin, ECG changes consistent with possible "missed STEMI")  New Diagnoses: DM-2, HTN, HLD  PROCEDURES PERFORMED:    LEFT HEART CATHETERIZATION WITH CORONARY ANGIOGRAM (N/A)   PERCUTANEOUS CORONARY INTERVENTION OF MID LAD 100% OCCLUSION WITH A PROMUS PREMIER DES 3.0 MM X 28 MM   PROCEDURE:Consent:  Risks of procedure as well as the alternatives and risks of each were explained to the (patient/caregiver).  Consent for procedure obtained. Consent for signed by MD and patient with RN witness -- placed on chart.   PROCEDURE: The patient was brought to the 2nd Floor [place] in the fasting state and prepped and draped in the usual sterile fashion for Right groin or radial access. A modified Allen's test with plethysmography was performed, revealing excellent Ulnar artery collateral flow.  Sterile technique was used including antiseptics, cap, gloves, gown, hand hygiene, mask and sheet.  Skin prep: Chlorhexidine.  Time Out: Verified patient identification, verified procedure, site/side was marked, verified  correct patient position, special equipment/implants available, medications/allergies/relevent history reviewed, required imaging and test results available.  Performed  Access: Initial attempts were made to access the right radial artery. However after several unsuccessful attempts, this approach was abandoned the plan to resort to femoral artery access.  Right Common Femoral Artery; 5 Fr Sheath -- fluoroscopically guided modified Seldinger technique.   Diagnostic:  TIG 4.0, (JL 3.5, JR 4, Angled Pigtail)  Left Coronary Artery Angiography: JL4  Right Coronary Artery Angiography: JR4  LV Hemodynamics (LV Gram): Angled Pigtail  Sheath:  Sutured in place; to be removed based upon ACT.   MEDICATIONS:  Anesthesia:  Local Lidocaine 18 ml  Sedation:  2 mg IV Versed, 50 mcg IV fentanyl ;   Premedication: 5 mg oral Valium  Omnipaque Contrast: 190 ml total (70 mL diagnostic)  Anticoagulation:  IV Heparin  6500 Units  Anti-Platelet Agent:  Integrilin drip; Prasugrel 60 mg by mouth  Hemodynamics:  Central Aortic / Mean Pressures: 109/85 mmHg; MAP 97 mmHg  Left Ventricular Pressures / EDP:  105/20 mmHg;  24 mmHg  Left Ventriculography:  EF:  35-40 %  Wall Motion:  Moderate mid to apical anterior hypokinesis  Coronary Anatomy:  Left Main:  Large-caliber, relatively short vessel that bifurcates shortly Into the LAD and Circumflex.  Angiographically normal.  LAD: Large-caliber vessel that gives off a proximal septal perforator followed by a major first diagonal branch. Starting at the takeoff of the diagonal branch the LAD tapers into a total occlusion with thrombus present. There is trace flow distally into the mid LAD that does not reach the the apex.  D1:  Moderate to large caliber branch with ostial 40-50% stenosis. This is not affected by the  occlusion in the LAD.  Left Circumflex:  Large-caliber vessel that essentially courses as a large lateral OM. There is a small AV groove  branch and a couple small marginal branches. Angiographically normal.   RCA:  Large-caliber dominant vessel; mid 30% lesion. Bifurcates distally into the RPDA and the  Right Posterior AV Groove Branch (RPAV); angiographically normal  RPDA: Moderate caliber vessel reaches two thirds the way to the apex; minimal luminal irregularities.  RPL Sysytem:The RPAV begins as a moderate caliber vessel gives off a small bifurcating posterolateral branch before it terminates as a major bifurcating posterior lateral branch.   Review of the angiographic images clearly demonstrated the culprit lesion being the occluded LAD. The decision made to proceed with percutaneous coronary intervention.  Percutaneous Coronary Intervention:  Sheath exchanged for 6 Fr Guide: 6 Fr    XB LAD 3.5 Guidewire:  pro-water Predilation Balloon:  Emerge MR 2.5 mm x  12 mm;   6 Atm x  30 Sec,  8 Atm x  30 Sec, 10 Atm x  45 Sec  TIMI-3 flow distally was restored after intracoronary nitroglycerin injection is confirmed by scout angiography.   Stent:  Promus Premier DES 3.0 mm x  28 mm;   Deployment -->12 Atm x  30 Sec, -- diameter 3.15 mm  Post-dilation with stent balloon --> 16 Atm x 45 Sec  Post-dilation Balloon:   Trek 3.25 mm x  15 mm;   16 Atm x  45 Sec,  mid stent --> diameter = 3.35 mm  18 Atm x  45 Sec, proximal stent --> diameter = 3.4 mm   Post deployment angiography in multiple views, with and without guidewire in place revealed excellent stent deployment and lesion coverage.  There was no evidence of dissection or perforation. TIMI-3 flow was restored down to the distal LAD.  PATIENT DISPOSITION:    The patient was transferred to the PACU holding area in a hemodynamicaly stable, chest pain free condition.  The patient tolerated the procedure well, and there were no complications.  EBL:   < 20 ml  The patient was stable before, during, and after the procedure.  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   Moderate mid to apical anterior hypokinesis - EF ~35-40% by LV Gram   PLAN OF CARE:  Continue to monitor in TCU DUE to the significance of his wall motion and evaluate and troponin elevation.    Anticipate transfer to telemetry for tomorrow and discharge in 2 days to allow time to adjust medications for both diabetes and hypertension as well as coronary disease.  Continue Integrilin for one more hour and discontinue.  Dual antiplatelet therapy for minimal in year -- Case Management consultation for medication assistance.   Cardiac Rehabilitation Phase I and Phase II Consultation  Considered nutrition consultation in the outpatient setting.  Upon followup we will follow up with myself and or Corine Shelter, [place].   Marykay Lex, M.D., M.S. [facility] [address] [place], [place]  [address]  [phone]  09/28/2012 11:01 AM

## 2012-09-29 DIAGNOSIS — I214 Non-ST elevation (NSTEMI) myocardial infarction: Secondary | ICD-10-CM

## 2012-09-29 LAB — BASIC METABOLIC PANEL
CO2: 27 mEq/L (ref 19–32)
Chloride: 99 mEq/L (ref 96–112)
GFR calc non Af Amer: >90 mL/min (ref >90)
Glucose, Bld: 188 mg/dL — ABNORMAL HIGH (ref 70–99)
Potassium: 3.4 mEq/L — ABNORMAL LOW (ref 3.5–5.1)
Sodium: 135 mEq/L (ref 135–145)

## 2012-09-29 LAB — CBC
Hemoglobin: 14.5 g/dL (ref 13.0–17.0)
MCHC: 33.7 g/dL (ref 30.0–36.0)
RBC: 4.93 MIL/uL (ref 4.22–5.81)
WBC: 9.9 10*3/uL (ref 4.0–10.5)

## 2012-09-29 LAB — GLUCOSE, CAPILLARY
Glucose-Capillary: 210 mg/dL — ABNORMAL HIGH (ref 70–99)
Glucose-Capillary: 217 mg/dL — ABNORMAL HIGH (ref 70–99)
Glucose-Capillary: 204 mg/dL — ABNORMAL HIGH (ref 70–99)

## 2012-09-29 MED ORDER — POTASSIUM CHLORIDE CRYS ER 20 MEQ PO TBCR
20.0000 meq | EXTENDED_RELEASE_TABLET | Freq: Once | ORAL | Status: AC
  Administered 2012-09-29: 20 meq via ORAL
  Filled 2012-09-29: qty 1

## 2012-09-29 NOTE — Progress Notes (Signed)
Inpatient Diabetes Program Recommendations  AACE/ADA: New Consensus Statement on Inpatient Glycemic Control (2013)  Target Ranges:  Prepandial:   less than 140 mg/dL      Peak postprandial:   less than 180 mg/dL (1-2 hours)      Critically ill patients:  140 - 180 mg/dL   Reason for Visit: Consult-- New onset diabetes  Results for NORMON, PETTIJOHN (MRN [id number]) as of 09/29/2012 12:07  Ref. Range 09/28/2012 07:44 09/28/2012 11:44 09/28/2012 16:42 09/28/2012 21:18 09/29/2012 07:46  Glucose-Capillary Latest Range: 70-99 mg/dL 086 (H) 578 (H) 469 (H) 231 (H) 210 (H)   Note:   Patient has started watching instructional DM videos.  RD has seen.  Discharge diabetes medications unknown at present.  Given high A1C, may need insulin initially in addition to oral agents.  (Diabetes Coordinator left some recommendations yesterday.)    Taught patient how to use an insulin pen using teach-back method.  If patient requires insulin, would much prefer a pen.    Gave patient/wife information regarding diabetes education available at [facility] at no charge.  They are aware they need to call ahead to register for the class.  The RD may be able to see him 1:1 after he has attended class should he have issues not addressed in the group instruction.  At discharge, patient needs:  F/U with PCP as soon as possible  Rx for a glucose meter, test strips, lancets  Written information regarding all medications  If D/C'd on insulin, will also need:  Rx for appropriate insulin pen   Rx for pen-needles  Thank you.  Sekai Gitlin S. Elsie Lincoln, RN, CNS, CDE Inpatient Diabetes Program, team pager [phone]

## 2012-09-29 NOTE — Progress Notes (Signed)
Transferred to 2038 by wheelchair, stable, report given to RN. Belongings with family.

## 2012-09-29 NOTE — Progress Notes (Signed)
[facility]  Subjective: No complaints. Denies CP/SOB.   Objective: Vital signs in last 24 hours: Temp:  [97.3 F (36.3 C)-98.8 F (37.1 C)] 97.3 F (36.3 C) (05/13 0741) Pulse Rate:  [70-89] 77 (05/13 0800) Resp:  [13-22] 15 (05/13 0800) BP: (85-134)/(55-89) 114/68 mmHg (05/13 0800) SpO2:  [96 %-100 %] 97 % (05/13 0800) Weight:  [293 lb 6.9 oz (133.1 kg)] 293 lb 6.9 oz (133.1 kg) (05/13 0402) Last BM Date: 09/28/12  Intake/Output from previous day: 05/12 0701 - 05/13 0700 In: 1059.2 [I.V.:1059.2] Out: 1225 [Urine:1225] Intake/Output this shift: Total I/O In: 120 [P.O.:120] Out: -   Medications Current Facility-Administered Medications  Medication Dose Route Frequency Provider Last Rate Last Dose  . 0.9 %  sodium chloride infusion   Intravenous Continuous Marykay Lex, MD      . acetaminophen (TYLENOL) tablet 650 mg  650 mg Oral Q4H PRN Leeann Must, MD   650 mg at 09/28/12 1610  . ALPRAZolam Prudy Feeler) tablet 0.25 mg  0.25 mg Oral TID PRN Abelino Derrick, PA-C      . aspirin EC tablet 81 mg  81 mg Oral Daily Leeann Must, MD   81 mg at 09/29/12 0906  . atorvastatin (LIPITOR) tablet 80 mg  80 mg Oral q1800 Leeann Must, MD   80 mg at 09/28/12 1755  . insulin aspart (novoLOG) injection 0-5 Units  0-5 Units Subcutaneous QHS Eda Paschal [place], PA-C   2 Units at 09/28/12 2123  . insulin aspart (novoLOG) injection 0-9 Units  0-9 Units Subcutaneous TID WC Abelino Derrick, PA-C   3 Units at 09/29/12 [phone]  . lisinopril (PRINIVIL,ZESTRIL) tablet 2.5 mg  2.5 mg Oral Daily Marykay Lex, MD   2.5 mg at 09/29/12 0906  . living well with diabetes book MISC   Does not apply Once Marykay Lex, MD      . metoprolol tartrate (LOPRESSOR) tablet 25 mg  25 mg Oral BID Abelino Derrick, PA-C   25 mg at 09/29/12 9562  . morphine 2 MG/ML injection 1 mg  1 mg Intravenous Q1H PRN Marykay Lex, MD      . nitroGLYCERIN (NITROSTAT) SL tablet 0.4 mg  0.4 mg Sublingual Q5 min PRN  Carleene Cooper III, MD   0.4 mg at 09/26/12 1646  . nitroGLYCERIN (NITROSTAT) SL tablet 0.4 mg  0.4 mg Sublingual Q5 Min x 3 PRN Leeann Must, MD      . nitroGLYCERIN 0.2 mg/mL in dextrose 5 % infusion  2-200 mcg/min Intravenous Titrated Marykay Lex, MD   5 mcg/min at 09/28/12 1900  . ondansetron (ZOFRAN) injection 4 mg  4 mg Intravenous Q6H PRN Leeann Must, MD   4 mg at 09/27/12 0535  . pantoprazole (PROTONIX) EC tablet 40 mg  40 mg Oral Q1200 Abelino Derrick, PA-C   40 mg at 09/28/12 1610  . prasugrel (EFFIENT) tablet 10 mg  10 mg Oral Daily Marykay Lex, MD   10 mg at 09/29/12 0906  . tamsulosin (FLOMAX) capsule 0.4 mg  0.4 mg Oral QPC supper Leeann Must, MD   0.4 mg at 09/28/12 1755  . zolpidem (AMBIEN) tablet 10 mg  10 mg Oral QHS PRN Abelino Derrick, PA-C        PE: General appearance: alert, cooperative, no distress and out of bed and in chair Lungs: clear to auscultation bilaterally Heart: regular rate and rhythm Extremities: no LEE, right groin stable no bruit  Pulses: 2+ and symmetric Skin: warm and dry Neurologic: Grossly normal  Lab Results:   Recent Labs  09/27/12 0412 09/28/12 0430 09/29/12 0415  WBC 12.3* 10.1 9.9  HGB 14.9 14.4 14.5  HCT 42.1 41.8 43.0  PLT 256 234 245   BMET  Recent Labs  09/27/12 0412 09/28/12 0430 09/29/12 0415  NA 137 133* 135  K 4.0 4.1 3.4*  CL 102 100 99  CO2 25 26 27   GLUCOSE 264* 231* 188*  BUN 8 5* 6  CREATININE 0.78 0.80 0.94  CALCIUM 8.5 8.6 9.1   PT/INR  Recent Labs  09/26/12 1544 09/27/12 0028 09/28/12 0430  LABPROT 13.2 13.5 13.6  INR 1.01 1.04 1.05   Cholesterol  Recent Labs  09/27/12 0412  CHOL 248*   Cardiac Enzymes Cardiac Panel (last 3 results)  Recent Labs  09/27/12 0028 09/27/12 0412  TROPONINI 10.55* >20.00*    Studies/Results: LHC 09/28/12 Hemodynamics:  Central Aortic / Mean Pressures: 109/85 mmHg; MAP 97 mmHg Left Ventricular Pressures / EDP: 105/20 mmHg; 24 mmHg Left  Ventriculography:  EF: 35-40 %  Wall Motion: Moderate mid to apical anterior hypokinesis Coronary Anatomy:  Left Main: Large-caliber, relatively short vessel that bifurcates shortly Into the LAD and Circumflex. Angiographically normal.  LAD: Large-caliber vessel that gives off a proximal septal perforator followed by a major first diagonal branch. Starting at the takeoff of the diagonal branch the LAD tapers into a total occlusion with thrombus present. There is trace flow distally into the mid LAD that does not reach the the apex.  D1: Moderate to large caliber branch with ostial 40-50% stenosis. This is not affected by the occlusion in the LAD. Left Circumflex: Large-caliber vessel that essentially courses as a large lateral OM. There is a small AV groove branch and a couple small marginal branches. Angiographically normal.  RCA: Large-caliber dominant vessel; mid 30% lesion. Bifurcates distally into the RPDA and the Right Posterior AV Groove Branch (RPAV); angiographically normal  RPDA: Moderate caliber vessel reaches two thirds the way to the apex; minimal luminal irregularities.  RPL Sysytem:The RPAV begins as a moderate caliber vessel gives off a small bifurcating posterolateral branch before it terminates as a major bifurcating posterior lateral branch.   Assessment/Plan  Principal Problem:   NSTEMI (non-ST elevated myocardial infarction) - possible Missed STEMI Active Problems:   Kidney stone   Obesity, unspecified   Dyslipidemia   Hyperglycemia  Plan: S/P LHC w/ PCI in the setting of NSTEMI. The cath revealed severe single vessel CAD with 100% mid LAD occlusion just after Moderate to large caliber D1 that has ~40% ostial/proximal stenosis. Successful PCI of culprit lesion with single Promus Premier DES 3.0 mm x 28 mm (post-dilated to 3.4 mm proximal, 3.3 mm mid & 3.15 mm distal). On DAPT w/ ASA + Effient.  There was also moderate mid to apical anterior hypokinesis with an EF of  ~35-40%. Continue w/ BB, ACE-I and statin. HR and BP both stable. Hypokalemic with K+ of 3.4. Will replete with supplemental O2. Ambulate today with [facility]. ?Discharge today or tomorrow. MD to follow.      LOS: 3 days    Brittainy M. Delmer Islam 09/29/2012 9:12 AM  Agree with note written by Boyce Medici  PAC  POD # 1 LAD PCI/Stent with DES. EKGs show Qs across precordium. Peak trop >20. No CP. Exam benign. Labs OK. K repleted. OK to transfer to tele. [facility]. Prob home AM. ROV with [facility] then [facility].   Nanetta Batty  J 09/29/2012 3:32 PM

## 2012-09-29 NOTE — Plan of Care (Signed)
Problem: Food- and Nutrition-Related Knowledge Deficit (NB-1.1) Goal: Nutrition education Formal process to instruct or train a patient/client in a skill or to impart knowledge to help patients/clients voluntarily manage or modify food choices and eating behavior to maintain or improve health. Outcome: Completed/Met Date Met:  09/29/12  RD consulted for nutrition education regarding new onset diabetes. Pt reports recent diet changes and weight loss of 40 lbs. Pt was encouraged to continue diet changes in addition to counting carbohydrates for better blood sugar control. It is interested in out patient nutrition education. RD will place referral     Lab Results  Component Value Date    HGBA1C 11.3* 09/27/2012    RD provided "Carbohydrate Counting for People with Diabetes" handout from the Academy of Nutrition and Dietetics. Discussed different food groups and their effects on blood sugar, emphasizing carbohydrate-containing foods. Provided list of carbohydrates and recommended serving sizes of common foods.  Discussed importance of controlled and consistent carbohydrate intake throughout the day. Provided examples of ways to balance meals/snacks and encouraged intake of high-fiber, whole grain complex carbohydrates. Teach back method used.  Expect good compliance.  Body mass index is 44.63 kg/(m^2). Pt meets criteria for obesity class 3, extreme based on current BMI.  Current diet order is Heart + Carb Mod, patient is consuming approximately 100% of meals at this time. Labs and medications reviewed. No further nutrition interventions warranted at this time. RD contact information provided. If additional nutrition issues arise, please re-consult RD.  Clarene Duke RD, LDN Pager [phone] After Hours pager [phone]

## 2012-09-29 NOTE — Progress Notes (Signed)
CARDIAC REHAB PHASE I   PRE:  Rate/Rhythm: 76 SR  BP:  Supine:105/75   Sitting:   Standing:    SaO2:   MODE:  Ambulation: 740 ft   POST:  Rate/Rhythm: 88 SR  BP:  Supine:   Sitting: 101/67  Standing:    SaO2:  1045-1135 Pt tolerated ambulation well without c/o of cp or SOB. VS stable Pt to recliner after walk with call light in reach. Completed MI and stent education with pt and wife. He voices understanding. Pt agrees to Outpt. CRP in [place], will send referral.  Melina Copa RN 09/29/2012 11:30 AM

## 2012-09-29 NOTE — Progress Notes (Signed)
Managed to give insulin injection to his abd. with good technique and confidence.

## 2012-09-30 DIAGNOSIS — E8881 Metabolic syndrome: Secondary | ICD-10-CM | POA: Diagnosis present

## 2012-09-30 DIAGNOSIS — I214 Non-ST elevation (NSTEMI) myocardial infarction: Secondary | ICD-10-CM

## 2012-09-30 HISTORY — DX: Metabolic syndrome: E88.81

## 2012-09-30 HISTORY — DX: Metabolic syndrome: E88.810

## 2012-09-30 LAB — GLUCOSE, CAPILLARY: Glucose-Capillary: 175 mg/dL — ABNORMAL HIGH (ref 70–99)

## 2012-09-30 LAB — BASIC METABOLIC PANEL
BUN: 11 mg/dL (ref 6–23)
Chloride: 100 mEq/L (ref 96–112)
GFR calc Af Amer: >90 mL/min (ref >90)
Potassium: 4 mEq/L (ref 3.5–5.1)

## 2012-09-30 LAB — TROPONIN I: Troponin I: 4.67 ng/mL (ref <0.30)

## 2012-09-30 MED ORDER — PANTOPRAZOLE SODIUM 40 MG PO TBEC: 40.0000 mg | DELAYED_RELEASE_TABLET | Freq: Every day | ORAL | Status: DC

## 2012-09-30 MED ORDER — METFORMIN HCL 500 MG PO TABS
500.0000 mg | ORAL_TABLET | Freq: Two times a day (BID) | ORAL | Status: DC
  Filled 2012-09-30 (×2): qty 1

## 2012-09-30 MED ORDER — ASPIRIN EC 81 MG PO TBEC: 81.0000 mg | DELAYED_RELEASE_TABLET | Freq: Every day | ORAL | Status: DC

## 2012-09-30 MED ORDER — METOPROLOL TARTRATE 12.5 MG HALF TABLET
12.5000 mg | ORAL_TABLET | Freq: Two times a day (BID) | ORAL | Status: DC
  Administered 2012-09-30: 12.5 mg via ORAL
  Filled 2012-09-30 (×2): qty 1

## 2012-09-30 MED ORDER — PRASUGREL HCL 10 MG PO TABS: 10.0000 mg | ORAL_TABLET | Freq: Every day | ORAL | Status: DC

## 2012-09-30 MED ORDER — NITROGLYCERIN 0.4 MG SL SUBL: 0.4000 mg | SUBLINGUAL_TABLET | SUBLINGUAL | Status: DC | PRN

## 2012-09-30 MED ORDER — LISINOPRIL 2.5 MG PO TABS: 2.5000 mg | ORAL_TABLET | Freq: Every day | ORAL | Status: DC

## 2012-09-30 MED ORDER — METOPROLOL TARTRATE 12.5 MG HALF TABLET: 12.5000 mg | ORAL_TABLET | Freq: Two times a day (BID) | ORAL | Status: DC

## 2012-09-30 MED ORDER — METFORMIN HCL 500 MG PO TABS: 500.0000 mg | ORAL_TABLET | Freq: Two times a day (BID) | ORAL | Status: DC

## 2012-09-30 MED ORDER — ATORVASTATIN CALCIUM 80 MG PO TABS: 80.0000 mg | ORAL_TABLET | Freq: Every day | ORAL | Status: DC

## 2012-09-30 NOTE — Discharge Summary (Signed)
Physician Discharge Summary  Patient ID: Thomas Beck MRN: [id number] DOB/AGE: [birth year] 47 y.o.  Admit date: 09/26/2012 Discharge date: 09/30/2012  Admission Diagnoses: NSTEMI  Discharge Diagnoses:  Principal Problem:   NSTEMI (non-ST elevated myocardial infarction) - possible Missed STEMI Active Problems:   Kidney stone   Obesity, unspecified   Dyslipidemia   Hyperglycemia   Metabolic syndrome - DM, HLD, Obesity   Discharged Condition: stable  Hospital Course: The patient is a  47 y.o. male (moderate to morbidly obese) with no prior PMH who presented to [facility] on 09/26/12 with a significant NSTEMI. He was found to be hypertensive, with dyslipidemia & likely new diagnosis of DM type 2. His Hgb A1c was 11.3. He had a troponin level >20 with suggestion of LAD distribution ischemic changes on ECG. Due to the significant level of Troponin elevation, he was treated overnight with IV Heparin and Nitroglycerin as well as IV Integrelin. He underwent a diagnostic LHC by Thomas Beck, via the right femoral approach. The cath revealed severe single vessel CAD with 100% mid LAD occlusion just after moderate to large caliber D1 that had ~40% ostial/proximal stenosis. This was successfully treated with percutaneous coronary intervention using a Promus Premier DES. He was also noted to have mid to apical anterior hypokinesis, with an EF of 35-40% by LV gram. He left the cath lab in stable condition. He had no further chest discomfort and the right femoral access site remained stable and free of complication. He was placed on DAPT with ASA and Effient. He was also placed on a BB, an ACE-I, statin and a PPI. A Diabetes Coordinator was consulted to assist with diabetes management. His diabetes was treated with insulin therapy as an inpatient, however he was placed only on Metformin at time of discharge. A dietitian provided patient education on healthy food options, including heart healthy diets. The patient was  educated on the importance of daily exercise for controlling DM, HTN and improving overall cardiovascular health. The patient endorsed optimism in regards to making positive lifestyle adjustments, including making better food choices, increasing physical activity and weight loss. The patient was last seen and examined by Thomas Beck, who felt he was stable for discharge home. The patient will follow-up with his PCP, Dr. Marisue Ivan, in [place]., who will manage his DM as an outpatient. Cardiovascular follow-up has been arranged with Thomas Finlay, PA-C on 10/06/12. He will also follow-up with Thomas Beck on 11/04/12. He is also set to be enrolled in Phase 2, cardiac rehab.    Consults: Diabetes Coordinator  Significant Diagnostic Studies:   LHC w/ PCI POST-OPERATIVE DIAGNOSIS:  Severe Single Vessel CAD with 100% mid LAD occlusion just after Moderate to large caliber D1 that has ~40% ostial/proximal stenosis  Successful PCI of culprit lesion with single Promus Premier DES 3.0 mm x 28 mm (post-dilated to 3.4 mm proximal, 3.3 mm mid & 3.15 mm distal) Minimal luminal irregularities in Cx-OM & RCA territories.  Moderate mid to apical anterior hypokinesis - EF ~35-40% by LV Gram  Hemodynamics:  LVP: 105/20 mmHg - 24 mmHg EDP  AoO: 109/85 mmHg; MAP 97 mmHg PROCEDURE: Attempted - aborted R Radial A access -- fluroscopically guided modified Seldinger's RFA - 5 Fr sheath; JL4 - LCA, JR4-RAC, Angled Pigtail - LV Gram & hemodynamics  PCI: echanged to 6 Fr sheat  Guide: 6Fr XB LAD - Guidewire: [place]  Predilation Emerge 2.5 mm x 12 mm  Stent: Promus Premier 3.0 mm x28 mm  Post-dilation: [place]  Trek 3.25 mm x 15 mm Post PCI Angiography reveal excellent stent deployment with lesion coverage, TIMI 3 flow in LAD & "jailed" D1. No dissection or perforation.   Treatments: See Hospital Course  Discharge Exam: Blood pressure 66/58, pulse 69, temperature 98 F (36.7 C), temperature source Oral, resp. rate  19, height 5\' 8"  (1.727 m), weight 290 lb 5.5 oz (131.7 kg), SpO2 99.00%.   Disposition: Final discharge disposition not confirmed  Discharge Orders   Future Appointments Provider Department Dept Phone   10/06/2012 4:00 PM Thomas Beck, [place] [facility] [place] [phone]   11/04/2012 2:30 PM Marykay Lex, MD [facility] [place] [phone]   Future Orders Complete By Expires     Amb Referral to Cardiac Rehabilitation  As directed     Comments:      Pt agrees to Outpt CRP in [place], will send referral.    Diet - low sodium heart healthy  As directed     Driving Restrictions  As directed     Comments:      No driving for 3 days    Increase activity slowly  As directed     Lifting restrictions  As directed     Comments:      No lifting more than 1/2 gallon of milk for 3 days        Medication List    TAKE these medications       aspirin EC 81 MG tablet  Take 1 tablet (81 mg total) by mouth daily.     atorvastatin 80 MG tablet  Commonly known as:  LIPITOR  Take 1 tablet (80 mg total) by mouth daily at 6 PM.     lisinopril 2.5 MG tablet  Commonly known as:  PRINIVIL,ZESTRIL  Take 1 tablet (2.5 mg total) by mouth daily.     metFORMIN 500 MG tablet  Commonly known as:  GLUCOPHAGE  Take 1 tablet (500 mg total) by mouth 2 (two) times daily with a meal.     metoprolol tartrate 12.5 mg Tabs  Commonly known as:  LOPRESSOR  Take 0.5 tablets (12.5 mg total) by mouth 2 (two) times daily.     nitroGLYCERIN 0.4 MG SL tablet  Commonly known as:  NITROSTAT  Place 1 tablet (0.4 mg total) under the tongue every 5 (five) minutes as needed for chest pain.     pantoprazole 40 MG tablet  Commonly known as:  PROTONIX  Take 1 tablet (40 mg total) by mouth daily at 12 noon.     prasugrel 10 MG Tabs  Commonly known as:  EFFIENT  Take 1 tablet (10 mg total) by mouth daily.     prasugrel 10 MG Tabs  Commonly known as:  EFFIENT   Take 1 tablet (10 mg total) by mouth daily.     tamsulosin 0.4 MG Caps  Commonly known as:  FLOMAX  Take 0.4 mg by mouth daily after supper.           Follow-up Information   Follow up with HAGER, BRYAN, PA-C On 10/06/2012. (4:00 pm)    Contact information:   [address] [place] [address] [phone]       Follow up with Marykay Lex, MD On 11/04/2012. (2:30 pm)    Contact information:   [address] [place] [address] [phone]      TIME SPENT ON DISCHARGE, INCLUDING PHYSICIAN TIME: >30 MINTUES  Signed: BRITTAINY M.  SIMMONS, PA-C 09/30/2012, 2:27 PM  I saw and evaluated the patient on the AM of discharge along with Boyce Medici, PA. I agree with her findings, examination as well as impression recommendations.   He looked & felt quite well.    BP is a bit low - have decreased BB dose to 12.5 bid, to allow for lwo dose ACE-I with his decreasd EF. NO HF Sx & no further angina  DAPT for minimum of 1 yr -- DES & PCI Cure trial; PPI for GI prophylaxis  A1C is quite high -- happy to see close f/u with new PCP -Dr. Marisue Ivan [place]; [phone]; will send records); will defer +/- Insulin Rx at that time --> will start Metformin 500 mg bid (for Metabolic Syndrome - DM, Obesity, HLD)  Will need to set up OP CR[facility] Phase 2 in ~[place] vs. [place].   Will need close cardiology f/u with myself or Mr. Diona Fanti, [place].  I spent an additional 10 minutes in counseling re: lifestyle modification, wgt loss, DM control; importance of CRH. Total MD & PA time with patient for d/c today ~40 min  Ki Corbo W, M.D., M.S. [facility] [address] [place], [place]  [address]  [phone] Pager # [phone] 10/07/2012 1:59 PM

## 2012-09-30 NOTE — Progress Notes (Signed)
Patient has done well self administering his insulin injections. Will continue to monitor.

## 2012-09-30 NOTE — Progress Notes (Signed)
Assessment unchanged. Discussed D/C instructions with pt and wife including DM edu, MI and stent edu, new medications, and f/u appointments. Pt and wife verbalized understanding. RX given to pt. IV and tele removed. Pt left via W/C accompanied by [employer].

## 2012-09-30 NOTE — Progress Notes (Signed)
CARDIAC REHAB PHASE I   PRE:  Rate/Rhythm: 69 SR    BP: sitting 119/76    SaO2:   MODE:  Ambulation: 510 ft   POST:  Rate/Rhythm: 91 SR    BP: sitting 112/76     SaO2:   Tolerated well, feels good. Further discussion of diet changes and apps/technology to assist them in change. Very motivated with good questions. [place] [facility] to contact him. [phone]   Elissa Lovett [place] CES, ACSM 09/30/2012 11:31 AM

## 2012-09-30 NOTE — Progress Notes (Addendum)
[facility]  Subjective: No further CP or SOB. Denies groin, back and flank pain. Feels great. Ready for discharge. Pt reports that he is motivated to make lifestyle changes, including better diet and increased exercise.   Objective: Vital signs in last 24 hours: Temp:  [98.1 F (36.7 C)-98.6 F (37 C)] 98.6 F (37 C) (05/14 0451) Pulse Rate:  [70-77] 73 (05/14 0451) Resp:  [10-20] 18 (05/14 0451) BP: (90-110)/(61-72) 95/62 mmHg (05/14 0451) SpO2:  [73 %-100 %] 98 % (05/14 0451) Weight:  [290 lb 5.5 oz (131.7 kg)] 290 lb 5.5 oz (131.7 kg) (05/14 0451) Last BM Date: 09/29/12  Intake/Output from previous day: 05/13 0701 - 05/14 0700 In: 570 [P.O.:570] Out: -  Intake/Output this shift:    Medications Current Facility-Administered Medications  Medication Dose Route Frequency Provider Last Rate Last Dose  . 0.9 %  sodium chloride infusion   Intravenous Continuous Marykay Lex, MD      . acetaminophen (TYLENOL) tablet 650 mg  650 mg Oral Q4H PRN Leeann Must, MD   650 mg at 09/28/12 1610  . ALPRAZolam Prudy Feeler) tablet 0.25 mg  0.25 mg Oral TID PRN Abelino Derrick, PA-C      . aspirin EC tablet 81 mg  81 mg Oral Daily Leeann Must, MD   81 mg at 09/29/12 0906  . atorvastatin (LIPITOR) tablet 80 mg  80 mg Oral q1800 Leeann Must, MD   80 mg at 09/29/12 1712  . insulin aspart (novoLOG) injection 0-5 Units  0-5 Units Subcutaneous QHS Abelino Derrick, PA-C   2 Units at 09/29/12 2142  . insulin aspart (novoLOG) injection 0-9 Units  0-9 Units Subcutaneous TID WC Abelino Derrick, PA-C   2 Units at 09/30/12 [phone]  . lisinopril (PRINIVIL,ZESTRIL) tablet 2.5 mg  2.5 mg Oral Daily Marykay Lex, MD   2.5 mg at 09/29/12 0906  . living well with diabetes book MISC   Does not apply Once Marykay Lex, MD      . metFORMIN (GLUCOPHAGE) tablet 500 mg  500 mg Oral BID WC Marykay Lex, MD      . metoprolol tartrate (LOPRESSOR) tablet 12.5 mg  12.5 mg Oral BID Marykay Lex, MD      . morphine 2 MG/ML injection 1 mg  1 mg Intravenous Q1H PRN Marykay Lex, MD      . nitroGLYCERIN (NITROSTAT) SL tablet 0.4 mg  0.4 mg Sublingual Q5 min PRN Carleene Cooper III, MD   0.4 mg at 09/26/12 1646  . nitroGLYCERIN (NITROSTAT) SL tablet 0.4 mg  0.4 mg Sublingual Q5 Min x 3 PRN Leeann Must, MD      . nitroGLYCERIN 0.2 mg/mL in dextrose 5 % infusion  2-200 mcg/min Intravenous Titrated Marykay Lex, MD   5 mcg/min at 09/28/12 1900  . ondansetron (ZOFRAN) injection 4 mg  4 mg Intravenous Q6H PRN Leeann Must, MD   4 mg at 09/27/12 0535  . pantoprazole (PROTONIX) EC tablet 40 mg  40 mg Oral Q1200 Abelino Derrick, PA-C   40 mg at 09/29/12 1221  . prasugrel (EFFIENT) tablet 10 mg  10 mg Oral Daily Marykay Lex, MD   10 mg at 09/29/12 0906  . tamsulosin (FLOMAX) capsule 0.4 mg  0.4 mg Oral QPC supper Leeann Must, MD   0.4 mg at 09/29/12 1712  . zolpidem (AMBIEN) tablet 10 mg  10 mg Oral QHS PRN Abelino Derrick,  PA-C        PE: General appearance: alert, cooperative and no distress; normal mood & affect Lungs: clear to auscultation bilaterally; non-labored Heart: regular rate and rhythm, no M/R/G Extremities: no LEE Pulses: 2+ and symmetric Skin: warm and dry Neurologic: Grossly normal Groin - c/d/i, no hematoma or bruit  Lab Results:   Recent Labs  09/28/12 0430 09/29/12 0415  WBC 10.1 9.9  HGB 14.4 14.5  HCT 41.8 43.0  PLT 234 245   BMET  Recent Labs  09/28/12 0430 09/29/12 0415  NA 133* 135  K 4.1 3.4*  CL 100 99  CO2 26 27  GLUCOSE 231* 188*  BUN 5* 6  CREATININE 0.80 0.94  CALCIUM 8.6 9.1   PT/INR  Recent Labs  09/28/12 0430  LABPROT 13.6  INR 1.05    Assessment/Plan  Principal Problem:   NSTEMI (non-ST elevated myocardial infarction) - possible Missed STEMI Active Problems:   Metabolic syndrome - DM, HLD, Obesity   Obesity, unspecified   Dyslipidemia   Hyperglycemia   Kidney stone  Plan: S/P PCI and stenting to the LAD w/ a  DES, in the setting of NSTEMI. POD#2. CP free. Continue with ASA + Effient, BB, ACE-I and statin. Rt groin remains stable. Pt was hypokalemic yesterday. Am labs are pending. Will supplement K+ if needed. If labs stable, plan for discharge home later today. Will need to address newly diagnosed DM at discharge. ? Sending home on insulin. Pt just scheduled a new appointment with his PCP for this coming Friday. May defer diabetes management to PCP. He may be a candidate for Metformin. I encouraged better diet and increased exercise. He seems motivated to change lifestyle. MD to follow with further recommendation.   Brittainy M. Sharol Harness, PA-C 09/30/2012 8:55 AM   LOS: 4 days    I have seen and evaluated the patient this AM along with Boyce Medici,  PA. I agree with her findings, examination as well as impression recommendations.  He looks & feels good.   BP is a bit low - have decreased BB dose to 12.5 bid, to allow for lwo dose ACE-I with his decreasd EF.  NO HF Sx & no further angina  DAPT for minimum of 1 yr -- DES & PCI Cure trial; PPI for GI prophylaxis  A1C is quite high -- happy to see close f/u with new PCP -Dr. Marisue Ivan [place], [place]; [phone]; will send records); will defer +/- Insulin Rx at that time --> will start Metformin 500 mg bid (for Metabolic Syndrome - DM, Obesity, HLD)  Will need to f/u AML to re-check K+ today. Anticipate d/c as soon as we are sure of K+ level & he has had final visit from [facility] to set up OP [facility] Phase 2 in ~[place] vs. [place].  Will need close cardiology f/u with myself or Mr. Diona Fanti, [place].  I spent an additional 10 minutes in counseling re: lifestyle modification, wgt loss, DM control; importance of CRH.  Total MD & PA time with patient for d/c today ~40 min  Brynnley Dayrit W, M.D., M.S. [facility] [address] [place], [place]  [address]  [phone] Pager # [phone] 09/30/2012 10:09  AM

## 2012-10-06 ENCOUNTER — Ambulatory Visit (INDEPENDENT_AMBULATORY_CARE_PROVIDER_SITE_OTHER): Payer: PRIVATE HEALTH INSURANCE | Admitting: Physician Assistant

## 2012-10-06 ENCOUNTER — Encounter: Payer: Self-pay | Admitting: Physician Assistant

## 2012-10-06 VITALS — BP 108/70 | HR 73 | Ht 68.0 in | Wt 287.0 lb

## 2012-10-06 DIAGNOSIS — I255 Ischemic cardiomyopathy: Secondary | ICD-10-CM

## 2012-10-06 DIAGNOSIS — I2510 Atherosclerotic heart disease of native coronary artery without angina pectoris: Secondary | ICD-10-CM

## 2012-10-06 DIAGNOSIS — I214 Non-ST elevation (NSTEMI) myocardial infarction: Secondary | ICD-10-CM

## 2012-10-06 DIAGNOSIS — I2589 Other forms of chronic ischemic heart disease: Secondary | ICD-10-CM

## 2012-10-06 DIAGNOSIS — E669 Obesity, unspecified: Secondary | ICD-10-CM

## 2012-10-06 DIAGNOSIS — R739 Hyperglycemia, unspecified: Secondary | ICD-10-CM

## 2012-10-06 DIAGNOSIS — R7309 Other abnormal glucose: Secondary | ICD-10-CM

## 2012-10-06 HISTORY — DX: Ischemic cardiomyopathy: I25.5

## 2012-10-06 HISTORY — DX: Atherosclerotic heart disease of native coronary artery without angina pectoris: I25.10

## 2012-10-06 MED ORDER — METOPROLOL TARTRATE 25 MG PO TABS: 12.5000 mg | ORAL_TABLET | Freq: Two times a day (BID) | ORAL | Status: DC

## 2012-10-06 MED ORDER — PRASUGREL HCL 10 MG PO TABS: 10.0000 mg | ORAL_TABLET | Freq: Every day | ORAL | Status: DC

## 2012-10-06 MED ORDER — PANTOPRAZOLE SODIUM 40 MG PO TBEC: 40.0000 mg | DELAYED_RELEASE_TABLET | Freq: Every day | ORAL | Status: DC

## 2012-10-06 MED ORDER — LISINOPRIL 2.5 MG PO TABS: 2.5000 mg | ORAL_TABLET | Freq: Every day | ORAL | Status: DC

## 2012-10-06 MED ORDER — ATORVASTATIN CALCIUM 80 MG PO TABS: 80.0000 mg | ORAL_TABLET | Freq: Every day | ORAL | Status: DC

## 2012-10-06 NOTE — Assessment & Plan Note (Signed)
The patient, his wife and I discussed monitoring weight every other day. Also discussed reduced sodium intake. His wife indicated that they were a reading food labels and on buying those low and sodium.  He currently appears euvolemic and is not in need of diuretics.

## 2012-10-06 NOTE — Assessment & Plan Note (Signed)
Treated with metformin, diet and exercise.

## 2012-10-06 NOTE — Assessment & Plan Note (Addendum)
Patient is being treated with aspirin, Effient, low-dose lisinopril, Lopressor and Lipitor.  We discussed going back to work next week. 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 I talked to him about taking it slow and that includes going back to sexual activity. Patient will follow up with Dr. Herbie Baltimore in approximately one month time. Cardiac rehabilitation should be initiated in part by then.  Recommend reassessment of LV function in approximately 3 months time.

## 2012-10-06 NOTE — Assessment & Plan Note (Signed)
The patient and his wife have made the drastic dietary changes that are tracking color intake.  We discussed appropriate foods and portioning.

## 2012-10-06 NOTE — Progress Notes (Signed)
Date:  10/06/2012   ID:  Thomas Beck, DOB [birth year], MRN [id number]  PCP:  Marisue Ivan, MD  Primary Cardiologist:  Herbie Baltimore     History of Present Illness: Harald Quevedo is a 47 y.o. male who is morbidly obese and has a history of numerous kidney stones.  He recently was admitted to [facility] with chest pain and had a significant non-ST elevation myocardial infarction with 100% occluded mid LAD. This is treated by Dr. Herbie Baltimore with a Promus drug-eluting stent along with aspirin and Effient.  Patient presents today for followup for hospitalization. His wife was present as well. She and the patient report drastic changes in dietary habits. His wife provided meal logs, blood pressure logs in weight logs they've been doing for the last week and half. Blood pressure has been well-controlled. We went through the dietary logs and discussed the various foods and options. Patient currently has no complaints of nausea, vomiting, chest pain, shortness of breath, diaphoresis, orthopnea, PND, Lotrimin edema, hematochezia, melena.  2-D echocardiogram is completed and revealed an ejection fraction in the range of 35-40%.  Severe hypokinesis of the mid-distalanteroseptal, anterior, and apical myocardium; in the distribution of the left anterior descending coronary artery.  Grade 2 diastolic dysfunction.    Wt Readings from Last 3 Encounters:  10/06/12 287 lb (130.182 kg)  09/30/12 290 lb 5.5 oz (131.7 kg)  09/30/12 290 lb 5.5 oz (131.7 kg)     Past Medical History  Diagnosis Date  . Kidney stone     Current Outpatient Prescriptions  Medication Sig Dispense Refill  . aspirin EC 81 MG tablet Take 1 tablet (81 mg total) by mouth daily.      Marland Kitchen atorvastatin (LIPITOR) 80 MG tablet Take 1 tablet (80 mg total) by mouth daily at 6 PM.  90 tablet  3  . lisinopril (PRINIVIL,ZESTRIL) 2.5 MG tablet Take 1 tablet (2.5 mg total) by mouth daily.  90 tablet  3  . metFORMIN (GLUCOPHAGE) 500 MG tablet Take 1  tablet (500 mg total) by mouth 2 (two) times daily with a meal.  60 tablet  5  . metoprolol tartrate (LOPRESSOR) 25 MG tablet Take 0.5 tablets (12.5 mg total) by mouth 2 (two) times daily.  180 tablet  3  . nitroGLYCERIN (NITROSTAT) 0.4 MG SL tablet Place 1 tablet (0.4 mg total) under the tongue every 5 (five) minutes as needed for chest pain.  25 tablet  5  . pantoprazole (PROTONIX) 40 MG tablet Take 1 tablet (40 mg total) by mouth daily at 12 noon.  90 tablet  3  . prasugrel (EFFIENT) 10 MG TABS Take 1 tablet (10 mg total) by mouth daily.  90 tablet  3  . tamsulosin (FLOMAX) 0.4 MG CAPS Take 0.4 mg by mouth daily after supper.       No current facility-administered medications for this visit.    Allergies:   No Known Allergies  Social History:  The patient  reports that he has never smoked. He does not have any smokeless tobacco history on file. He reports that  drinks alcohol.   ROS:  Please see the history of present illness.  All other systems reviewed and negative.   PHYSICAL EXAM: VS:  BP 108/70  Pulse 73  Ht 5\' 8"  (1.727 m)  Wt 287 lb (130.182 kg)  BMI 43.65 kg/m2 Well nourished, well developed, in no acute distress HEENT: Pupils are equal round react to light accommodation extraocular movements are intact.  Neck: no JVDNo  cervical lymphadenopathy. Cardiac: Regular rate and rhythm without murmurs rubs or gallops. Lungs:  clear to auscultation bilaterally, no wheezing, rhonchi or rales Abd: soft, nontender, positive bowel sounds all quadrants, no hepatosplenomegaly Ext: no lower extremity edema.  2+ radial and dorsalis pedis pulses. Skin: warm and dry Neuro:  Grossly normal  EKG:  Normal sinus rhythm with large septal Q waves rate of 72 beats per minute.  ASSESSMENT AND PLAN:  Problem List Items Addressed This Visit   NSTEMI (non-ST elevated myocardial infarction) - possible Missed STEMI   Relevant Medications      lisinopril (PRINIVIL,ZESTRIL) tablet      metoprolol  tartrate (LOPRESSOR) tablet      atorvastatin (LIPITOR) tablet   Other Relevant Orders      EKG 12-Lead   Obesity, unspecified     The patient and his wife have made the drastic dietary changes that are tracking color intake.  We discussed appropriate foods and portioning.    Hyperglycemia     Treated with metformin, diet and exercise.    Coronary artery disease: 09/28/2012: 100% occluded LAD in the midportion and treated with a Promus drug eluting stent - Primary     Patient is being treated with aspirin, Effient, low-dose lisinopril, Lopressor and Lipitor.  We discussed going back to work next week. 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 I talked to him about taking it slow and that includes going back to sexual activity. Patient will follow up with Dr. Herbie Baltimore in approximately one month time. Cardiac rehabilitation should be initiated in part by then.  Recommend reassessment of LV function in approximately 3 months time.    Relevant Medications      lisinopril (PRINIVIL,ZESTRIL) tablet      metoprolol tartrate (LOPRESSOR) tablet      atorvastatin (LIPITOR) tablet   Cardiomyopathy, ischemic: Ejection fraction 35-40% grade 2 diastolic dysfunction     The patient, his wife and I discussed monitoring weight every other day. Also discussed reduced sodium intake. His wife indicated that they were a reading food labels and on buying those low and sodium.  He currently appears euvolemic and is not in need of diuretics.    Relevant Medications      lisinopril (PRINIVIL,ZESTRIL) tablet      metoprolol tartrate (LOPRESSOR) tablet      atorvastatin (LIPITOR) tablet

## 2012-11-04 ENCOUNTER — Encounter: Payer: Self-pay | Admitting: Cardiology

## 2012-11-04 ENCOUNTER — Ambulatory Visit (INDEPENDENT_AMBULATORY_CARE_PROVIDER_SITE_OTHER): Payer: PRIVATE HEALTH INSURANCE | Admitting: Cardiology

## 2012-11-04 VITALS — BP 110/60 | HR 64 | Ht 68.0 in | Wt 286.1 lb

## 2012-11-04 DIAGNOSIS — I214 Non-ST elevation (NSTEMI) myocardial infarction: Secondary | ICD-10-CM

## 2012-11-04 DIAGNOSIS — E785 Hyperlipidemia, unspecified: Secondary | ICD-10-CM

## 2012-11-04 DIAGNOSIS — R739 Hyperglycemia, unspecified: Secondary | ICD-10-CM

## 2012-11-04 DIAGNOSIS — R7309 Other abnormal glucose: Secondary | ICD-10-CM

## 2012-11-04 DIAGNOSIS — I2589 Other forms of chronic ischemic heart disease: Secondary | ICD-10-CM

## 2012-11-04 DIAGNOSIS — E8881 Metabolic syndrome: Secondary | ICD-10-CM

## 2012-11-04 DIAGNOSIS — I2510 Atherosclerotic heart disease of native coronary artery without angina pectoris: Secondary | ICD-10-CM

## 2012-11-04 DIAGNOSIS — I255 Ischemic cardiomyopathy: Secondary | ICD-10-CM

## 2012-11-04 NOTE — Patient Instructions (Addendum)
You are doing very well after your heart attack.  As you know, you heart function did take a "hit" with the heart attack. The Metoprolol & lisinopril are there to help the healing process.  Aspirin & Effient are to keep the stent open.  I am excited to see how well your sugars are controlled.  Enjoy Cardiac Rehab.  We will recheck your Echocardiogram (heart ultrasound) in early September along with Cholesterol levels.  I will see you back after that.   You are now free to do full activities in ~1 week, but can certainly start moderate activity now.  Hold your Lipitor (atorvastain) x 2 weeks, then cut the dose in 1/2. Start CoQ10 (100 md daily & increase every week up to 300 mg)  Marykay Lex, MD

## 2012-11-12 ENCOUNTER — Encounter: Payer: Self-pay | Admitting: Cardiology

## 2012-11-12 NOTE — Assessment & Plan Note (Signed)
Almost 6 weeks out. I am this week he should be ready to go to full activity starting on Monday. He starts cardiac rehabilitation on Friday, which is perfect timing. He is asking about the money to get back to sexual relations with his wife and I think that'll be fine by this weekend.

## 2012-11-12 NOTE — Assessment & Plan Note (Signed)
He has done well so far with weight loss. I look forward to see how is doing with his aggressive dietary modifications and increasing his exercise level. His goal should be to lose 29 pounds in the first year to 10% of his premorbid body weight. He can get dietary indication during cardiac rehabilitation, but following that he can benefit from continued dietary education.

## 2012-11-12 NOTE — Progress Notes (Signed)
Patient ID: An Schnabel, male   DOB: Jun 28, 1965, 47 y.o.   MRN: [id number]  Clinic Note: HPI: Thomas Beck is a 47 y.o. male with a PMH below who presents today for his recent non-ST elevation MI in May 2014 with a PCI to the LAD.  His MI related anginal pain was "like a punch in the chest that wouldn't go away ". He would have increased symptoms and increased symptoms over the course of her stay with it before he presented. Prior to his and admission for non-STEMI, which appeared to almost be a missed anterior STEMI as his angiography revealed 100% occluded mid LAD, he was only morbidly obese with a history of numerous episodes of nephrolithiasis. He saw Mr. Wilburt Finlay, [place] on May 20 for initial post Followup. He had reported dramatic lifestyle dietary and exercise habit changes. He did have a post MI echocardiogram performed which revealed an ejection fraction that was moderate to severely reduced at 35-40%. This was associated with LAD wall motion abnormality.  Interval History: Since his last visit he sees doing quite well, he is really wanting to get back to doing exercise. He wants to go to cardiac rehabilitation was noted to start. He wants to know when he can be more active physically active. He does note a little the leg cramping occasionally. He did have one episode that lasted just a few seconds where he felt like he was "pus in the chest. That lasted very briefly and has never had that began since that hospital, since then he has never had any other episodes. He denies any canal chest pain with rest or exertion. No PND, orthopnea or edema to suggest any heart diarrhea. He denies any lightheadedness, dizziness no wheezes or 60/near-syncope. No TIA or amaurosis fugax symptoms. No melena, hematochezia or hematuria. No claudication symptoms.  Past Medical History  Diagnosis Date  . Kidney stone   . NSTEMI (non-ST elevated myocardial infarction) - possible Missed STEMI 09/27/2012  . Coronary  artery disease: 09/28/2012: 100% occluded LAD in the midportion and treated with a Promus drug eluting stent 10/06/2012  . Cardiomyopathy, ischemic: Ejection fraction 35-40% grade 2 diastolic dysfunction 10/06/2012  . Obesity, morbid (more than 100 lbs over ideal weight or BMI > 40) 09/27/2012  . Hyperglycemia 09/27/2012  . Dyslipidemia 09/27/2012  . Metabolic syndrome - DM, HLD, Obesity 09/30/2012   Prior Cardiac Evaluation and Past Surgical History: Past Surgical History  Procedure Laterality Date  . Cardiac catheterization  09/28/2012    Severe 1VCAD --100% mid LAD occlusion just after Mod-large D1 with a ~40% ostial/proximal stenosis;  . Coronary angioplasty with stent placement  09/28/2012    Mid LAD: Promus Premier DES 3.0 mm x 28 mm (post-dilated to 3.4 mm proximal, 3.3 mm mid & 3.15 mm distal)  . Doppler echocardiography  09/27/2012    EF 35-40%, moderately reduced. Severe hypokinesis of the mid to distal anteroseptal, anterior and apical myocardium consistent LAD infarction. Grade 2 diastolic dysfunction.   No Known Allergies  Current Outpatient Prescriptions  Medication Sig Dispense Refill  . aspirin EC 81 MG tablet Take 1 tablet (81 mg total) by mouth daily.      Marland Kitchen atorvastatin (LIPITOR) 80 MG tablet Take 1 tablet (80 mg total) by mouth daily at 6 PM.  90 tablet  3  . lisinopril (PRINIVIL,ZESTRIL) 2.5 MG tablet Take 1 tablet (2.5 mg total) by mouth daily.  90 tablet  3  . metFORMIN (GLUCOPHAGE) 500 MG tablet Take 1  tablet (500 mg total) by mouth 2 (two) times daily with a meal.  60 tablet  5  . metoprolol tartrate (LOPRESSOR) 25 MG tablet Take 0.5 tablets (12.5 mg total) by mouth 2 (two) times daily.  180 tablet  3  . nitroGLYCERIN (NITROSTAT) 0.4 MG SL tablet Place 1 tablet (0.4 mg total) under the tongue every 5 (five) minutes as needed for chest pain.  25 tablet  5  . pantoprazole (PROTONIX) 40 MG tablet Take 1 tablet (40 mg total) by mouth daily at 12 noon.  90 tablet  3  .  prasugrel (EFFIENT) 10 MG TABS Take 1 tablet (10 mg total) by mouth daily.  90 tablet  3  . tamsulosin (FLOMAX) 0.4 MG CAPS Take 0.4 mg by mouth daily after supper.       No current facility-administered medications for this visit.    History   Social History  . Marital Status: Married    Spouse Name: N/A    Number of Children: N/A  . Years of Education: N/A   Occupational History  . Principal     [employer]   Social History Main Topics  . Smoking status: Never Smoker   . Smokeless tobacco: Not on file  . Alcohol Use: Yes     Comment: seldom  . Drug Use: Not on file  . Sexually Active: Not on file   Other Topics Concern  . 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 He has not been that active up to now.   ROS: A comprehensive Review of Systems - Negative except Minimal pertinent positives noted above. Otherwise negative.  PHYSICAL EXAM BP 110/60  Pulse 64  Ht 5\' 8"  (1.727 m)  Wt 286 lb 1.6 oz (129.774 kg)  BMI 43.51 kg/m2 -- weight in the last encounter was 287, that is down to 90 in the hospital. General appearance: alert, cooperative, appears stated age, no distress, morbidly obese and Very pleasant mood and affect. Well-nourished and well-groomed. Answers questions appropriately. Neck: no carotid bruit, supple, symmetrical, trachea midline and Unable to assess for JVP base of his body habitus. Very short, thick neck Lungs: clear to auscultation bilaterally, normal percussion bilaterally and Nonlabored, good air movement. Heart: regular rate and rhythm, S1, S2 normal, no murmur, click, rub or gallop and Somewhat distant sounds due to body habitus. Unable to palpate PMI. Abdomen: normal findings: aorta normal, bowel sounds normal, no bruits heard, no masses palpable, soft, non-tender, umbilicus normal and Unable to tell me at this due to body habitus. and abnormal findings:  obese and Morbid  truncal obesity Extremities: extremities normal, atraumatic, no cyanosis or edema, no edema, redness or tenderness in the calves or thighs and no ulcers, gangrene or trophic changes Pulses: 2+ and symmetric Neurologic: Alert and oriented X 3, normal strength and tone. Normal symmetric reflexes. Normal coordination and gait HEENT: [place]/AT, EOMI, MMM, anicteric sclera  [id number] today: Yes Rate: 64 , Rhythm: Normal sinus;  evolving changes of anteroseptal MI, recent -- Q waves in V1 through V4 with T wave inversions and still some mild residual ST elevation, notably improved from his initial presentation.  Recent Labs:None since discharge  ASSESSMENT: Doing remarkably well just over one month post anterior MI. He does not seem to be acting like someone who has an EF of 35-40% with no active anginal symptoms or heart failure symptoms. He is just about  6 weeks out from his MI and was to go to full activity. He should be starting his cardiac rehabilitation this Friday. Please note Mr. Leron Croak as discussion of his dietary modification and desire to exercise for weight loss. He has lost 4-5 pounds since his initial presentation.  Patient Active Problem List   Diagnosis Date Noted  . Coronary artery disease: 09/28/2012: 100% occluded LAD in the midportion and treated with a Promus drug eluting stent 10/06/2012    Priority: High  . Cardiomyopathy, ischemic: Ejection fraction 35-40% grade 2 diastolic dysfunction 10/06/2012    Priority: High  . Metabolic syndrome - DM, HLD, Obesity 09/30/2012    Priority: High  . NSTEMI (non-ST elevated myocardial infarction) - possible Missed STEMI 09/27/2012    Priority: High  . Obesity, morbid (more than 100 lbs over ideal weight or BMI > 40) 09/27/2012    Priority: Medium  . Dyslipidemia 09/27/2012    Priority: Medium  . Hyperglycemia 09/27/2012    Priority: Medium  . Kidney stone     Priority: Low    PLAN: Per problem list. Orders Placed This Encounter   Procedures  . Lipid Profile    Standing Status: Future     Number of Occurrences:      Standing Expiration Date: 11/04/2013    Order Specific Question:  Has the patient fasted?    Answer:  Yes  . Comp Met (CMET)    Standing Status: Future     Number of Occurrences:      Standing Expiration Date: 11/04/2013    Order Specific Question:  Has the patient fasted?    Answer:  Yes  . EKG 12-Lead  . 2D Echocardiogram without contrast    Standing Status: Future     Number of Occurrences:      Standing Expiration Date: 11/04/2013    Scheduling Instructions:     Schedule for last August - Early September    Order Specific Question:  Type of Echo    Answer:  Complete    Order Specific Question:  Where should this test be performed    Answer:  [facility]    Order Specific Question:  Reason for exam-Echo    Answer:  Cardiomyopathy-Ischemic  414.8     Followup: Roughly 3 months  Macai Sisneros W, M.D., M.S. [facility] [address] [place], [place]  [address]  [phone] Pager # [phone] 11/12/2012 1:12 AM

## 2012-11-12 NOTE — Assessment & Plan Note (Addendum)
Because he is having a little aching in cramping in his legs, that he decrease his Lipitor to 40 mg after holding it for 2 weeks. We Will Also Start Coenzyme Q 10 at 100 mg daily and increase up to 300 mg if possible. Recheck his lipid panel and chemistries when he comes in for his echocardiogram in 3 months. This will allow me to follow up his cardiac risk factors as well as his cardiac function when seen back in just over 3 months.

## 2012-11-12 NOTE — Assessment & Plan Note (Signed)
On dual antiplatelet therapy of aspirin plus Effient. He needs to be on this for a minimum of 1 year, after which time I'll probably switch him potentially to either Plavix or Brilinta for a long-term regimen. He has sublingual nitroglycerin which is not requiring. His also on beta blocker, ACE inhibitor and a statin.

## 2012-11-12 NOTE — Assessment & Plan Note (Addendum)
He is tolerating this very well. No active anginal or heart failure symptoms. He is currently on an ACE inhibitor and a beta blocker with well-controlled blood pressure and heart rate. No room to increase doses this time. He did have elevated filling pressures while in the hospital both on echocardiogram and catheterization, and diuresis in the hospital. He not having any ENT or orthopnea or edema to suggest volume overload. He did not require diuretic. Will reevaluate his ejection fraction in roughly 3 months (September) to reassess his ejection fraction following revascularization and optimize medical therapy.

## 2012-11-12 NOTE — Assessment & Plan Note (Signed)
We started him on metformin due to elevated blood sugars in the hospital. He says his blood sugars have been much better since getting home. This is probably also a note small amount due to his aggressive dietary modifications. His primary physician aggressively taken over monitoring his sugars and adjust medications accordingly.  I be happy to defer this to Dr. Adria Dill' expertise.

## 2012-11-12 NOTE — Assessment & Plan Note (Signed)
As we discussed in his hospital stay, the combination of obesity, dyslipidemia with it low HDL and/or high triglycerides, elevated blood sugars and hypertension gives her the diagnosis of metabolic syndrome. This carries a higher overall cardiovascular risk and diabetes alone. For this reason we need to be very aggressively treating his lipids and his diabetes. Part of this involves aggressive weight loss therapy. It would be very happy to refer him to a nutritionist if you begin to stabilize him back.

## 2012-12-08 ENCOUNTER — Encounter (HOSPITAL_COMMUNITY): Payer: PRIVATE HEALTH INSURANCE

## 2013-01-21 ENCOUNTER — Encounter: Payer: Self-pay | Admitting: Cardiology

## 2013-01-21 ENCOUNTER — Ambulatory Visit (HOSPITAL_COMMUNITY)
Admission: RE | Admit: 2013-01-21 | Discharge: 2013-01-21 | Disposition: A | Discharge status: Home / Self Care | Payer: BC Managed Care – PPO | Source: Ambulatory Visit | Attending: Cardiovascular Disease | Admitting: Cardiovascular Disease

## 2013-01-21 DIAGNOSIS — I2589 Other forms of chronic ischemic heart disease: Secondary | ICD-10-CM | POA: Diagnosis present

## 2013-01-21 DIAGNOSIS — I255 Ischemic cardiomyopathy: Secondary | ICD-10-CM

## 2013-01-21 NOTE — Progress Notes (Signed)
[facility]   2D echo completed 01/21/2013.   Cindy Verleen Stuckey, RDCS  

## 2013-01-26 ENCOUNTER — Telehealth: Payer: Self-pay | Admitting: *Deleted

## 2013-01-26 NOTE — Telephone Encounter (Signed)
Message copied by Tobin Chad on Tue Jan 26, 2013 10:44 AM ------      Message from: Herbie Baltimore, DAVID      Created: Thu Jan 21, 2013  1:32 PM       Great NEWS!! Back to normal Heart Function!!!      Pump function has essentially fully recovered!            Marykay Lex, MD       ------

## 2013-01-28 ENCOUNTER — Telehealth: Payer: Self-pay | Admitting: *Deleted

## 2013-01-28 NOTE — Telephone Encounter (Signed)
Message copied by Tobin Chad on Thu Jan 28, 2013  4:36 PM ------      Message from: [facility], DAVID      Created: Thu Jan 21, 2013  1:32 PM       Great NEWS!! Back to normal Heart Function!!!      Pump function has essentially fully recovered!            Marykay Lex, MD       ------

## 2013-01-28 NOTE — Telephone Encounter (Signed)
Called no answer . Will receive results at next appointment 02/09/13

## 2013-02-09 ENCOUNTER — Ambulatory Visit (INDEPENDENT_AMBULATORY_CARE_PROVIDER_SITE_OTHER): Payer: BC Managed Care – PPO | Admitting: Cardiology

## 2013-02-09 ENCOUNTER — Encounter: Payer: Self-pay | Admitting: Cardiology

## 2013-02-09 VITALS — BP 100/60 | HR 60 | Ht 67.0 in | Wt 286.9 lb

## 2013-02-09 DIAGNOSIS — E785 Hyperlipidemia, unspecified: Secondary | ICD-10-CM

## 2013-02-09 DIAGNOSIS — E8881 Metabolic syndrome: Secondary | ICD-10-CM

## 2013-02-09 DIAGNOSIS — R739 Hyperglycemia, unspecified: Secondary | ICD-10-CM

## 2013-02-09 DIAGNOSIS — R7309 Other abnormal glucose: Secondary | ICD-10-CM

## 2013-02-09 DIAGNOSIS — I2510 Atherosclerotic heart disease of native coronary artery without angina pectoris: Secondary | ICD-10-CM

## 2013-02-09 NOTE — Patient Instructions (Addendum)
You are doing great.  CONTINUE WITH EXERCISE AND DIET  Your physician wants you to follow-up in MAY 2015.  You will receive a reminder letter in the mail two months in advance. If you don't receive a letter, please call our office to schedule the follow-up appointment.

## 2013-02-14 ENCOUNTER — Encounter: Payer: Self-pay | Admitting: Cardiology

## 2013-02-14 NOTE — Progress Notes (Signed)
Patient ID: Thomas Beck, male   DOB: 1965-09-18, 47 y.o.   MRN: [id number]  Clinic Note: HPI: Thomas Beck is a 47 y.o. 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 I met him in May of this year when he presented with a non-STEMI, he was found to have LAD occlusion (potentially missed anterior STEMI) treated with PCI.  When I last saw him, he had reported dramatic lifestyle dietary and exercise habit changes. He did have a post MI echocardiogram performed which revealed an ejection fraction that was moderate to severely reduced at 35-40%. This was associated with LAD wall motion abnormality. He was evaluated with a echocardiogram on 01/21/2013 to reassess his EF -- this demonstrated essentially recovery of EF to 55-60%. Otherwise it was essentially normal.  Interval History: Since his last visit he sees doing quite well, he has in trying to get back to doing exercise. He easily graduated from cardiac rehabilitation. He says he is adjusting his diet as well, however he is not had any notable weight loss for evaluation. He denies any chest pain or pressure with rest or exertion. No PND, orthopnea or edema. He had mild hematuria due to nephrolithiasis but otherwise no melena, hematochezia or recurrent hematuria.   He denies any lightheadedness, dizziness no wheezes or syncope/near-syncope. No TIA or amaurosis fugax symptoms. No melena, hematochezia or hematuria. No claudication symptoms. No claudication symptoms.  Past Medical History  Diagnosis Date  . Kidney stone   . NSTEMI (non-ST elevated myocardial infarction) - possible Missed STEMI 09/27/2012  . Coronary artery disease: 09/28/2012: 100% occluded LAD in the midportion and treated with a Promus drug eluting stent 10/06/2012  . Cardiomyopathy, ischemic: Ejection fraction 35-40% grade 2 diastolic dysfunction 10/06/2012    Resolved  . H/O echocardiogram 01/21/2013    EF 55-60%, normal WM -- normal Echo!!  . Hyperglycemia 09/27/2012  .  Dyslipidemia 09/27/2012  . Metabolic syndrome - DM, HLD, Obesity 09/30/2012  . Obesity, morbid (more than 100 lbs over ideal weight or BMI > 40)    Prior Cardiac Evaluation and Past Surgical History: Past Surgical History  Procedure Laterality Date  . Cardiac catheterization  09/28/2012    Severe 1VCAD --100% mid LAD occlusion just after Mod-large D1 with a ~40% ostial/proximal stenosis;  . Coronary angioplasty with stent placement  09/28/2012    Mid LAD: Promus Premier DES 3.0 mm x 28 mm (post-dilated to 3.4 mm proximal, 3.3 mm mid & 3.15 mm distal)  . Doppler echocardiography  09/27/2012    EF 35-40%, moderately reduced. Severe hypokinesis of the mid to distal anteroseptal, anterior and apical myocardium consistent LAD infarction. Grade 2 diastolic dysfunction.   Allergies  Allergen Reactions  . Strawberry     Current Outpatient Prescriptions  Medication Sig Dispense Refill  . aspirin EC 81 MG tablet Take 1 tablet (81 mg total) by mouth daily.      Marland Kitchen atorvastatin (LIPITOR) 80 MG tablet Take 1 tablet (80 mg total) by mouth daily at 6 PM.  90 tablet  3  . lisinopril (PRINIVIL,ZESTRIL) 2.5 MG tablet Take 1 tablet (2.5 mg total) by mouth daily.  90 tablet  3  . metFORMIN (GLUCOPHAGE) 500 MG tablet Take 1 tablet (500 mg total) by mouth 2 (two) times daily with a meal.  60 tablet  5  . metoprolol tartrate (LOPRESSOR) 25 MG tablet Take 0.5 tablets (12.5 mg total) by mouth 2 (two) times daily.  180 tablet  3  . nitroGLYCERIN (NITROSTAT) 0.4  MG SL tablet Place 1 tablet (0.4 mg total) under the tongue every 5 (five) minutes as needed for chest pain.  25 tablet  5  . pantoprazole (PROTONIX) 40 MG tablet Take 1 tablet (40 mg total) by mouth daily at 12 noon.  90 tablet  3  . prasugrel (EFFIENT) 10 MG TABS Take 1 tablet (10 mg total) by mouth daily.  90 tablet  3  . tamsulosin (FLOMAX) 0.4 MG CAPS Take 0.4 mg by mouth daily after supper.       No current facility-administered medications for this  visit.   History   Social History Narrative   He is a married high school principal. His daughter just graduated from high school. He has not been that active up to the time of his PCI for STEMI/non-STEMI. He has picked up his level of exercise and is trying to adjust his diet.   He is using a pedometer to measure now point wall a daily basis.   ROS: A comprehensive Review of Systems - Negative except Minimal pertinent positives noted above. Otherwise negative.  PHYSICAL EXAM BP 100/60  Pulse 60  Ht 5\' 7"  (1.702 m)  Wt 286 lb 14.4 oz (130.137 kg)  BMI 44.92 kg/m2 -- weight in the last encounter was 287, that is down to 90 in the hospital. General: A&Ox3, cooperative, appears stated age, no distress, morbidly obese and Very pleasant mood and affect. Answers questions appropriately. Well-groomed Neck: no carotid bruit, supple, Unable to assess for JVP base of his body habitus. Very short, thick neck Lungs: CTA B., normal percussion bilaterally and Nonlabored, good air movement. Heart: RRR, S1, S2 normal (but distant), no murmur, click, rub or gallop Abdomen: Obese; aorta normal, bowel sounds normal, no bruits heard, no masses palpable, soft, non-tender, umbilicus normal and unable to palpate a HSM do to obesity. Extremities: extremities normal, atraumatic, no cyanosis or edema, no edema, redness or tenderness in the calves or thighs and no ulcers, gangrene or trophic changes Pulses: 2+ and symmetric Neurologic: Alert and oriented X 3, normal strength and tone. Normal symmetric reflexes. Normal coordination and gait HEENT: [place]/AT, EOMI, MMM, anicteric sclera  [id number] today: Yes Rate: 64 , Rhythm: Normal sinus;  evolving changes of anteroseptal MI, recent -- Q waves in V1 through V4 with T wave inversions and still some mild residual ST elevation, notably improved from his initial presentation.  Recent Labs: From PCP (end of August):  TC-1 60, TG 48, LDL 105, HDL 45 --> excellent  improvement.  BUN/creatinine 15/1.2  ASSESSMENT/PLAN: Coronary artery disease: 09/28/2012: 100% occluded LAD in the midportion and treated with a Promus drug eluting stent Doing well no active symptoms of angina or heart failure. On a stable dose of beta blocker and ACE inhibitor, as best as able to be pushed with his blood pressure. His also on statin and DAPT (aspirin/Effient) no active signs of bleeding. Continue current therapy for now. Simply encouraged increased exercise and adjusting her diet.  Obesity, morbid (more than 100 lbs over ideal weight or BMI > 40) Continue to encourage exercise and dietary modification. We again stressed the goal to lose 1/10 of the body weight per year. This would make it easier to keep the weight off. If he has difficulty, would consider dietary consultation.  Metabolic syndrome - DM, HLD, Obesity Dangerous combination of obesity, dyslipidemia and low HDL in the past. Thankfully his HDL levels have improved due to diet/exercise and statin. He still has many of the worrisome  features features, only controlled with medications..  Hyperglycemia Currently taking metformin. His last glucose level was 100. His A1c was 6.1 showing borderline prediabetes. Would defer further adjustment to his primary physician.  Dyslipidemia, goal LDL below 70 Notable improvement with dietary modification, exercise and addition of statin. He'll be due for lipid panel check in about 6 months to a year; this has previously been followed by his primary physician.    No orders of the defined types were placed in this encounter.    Followup: Roughly May 2015  Marykay Lex, M.D., M.S. [facility] [address] [place], [place]  [address]  [phone] Pager # [phone] 02/14/2013 9:37 AM

## 2013-02-14 NOTE — Assessment & Plan Note (Signed)
Dangerous combination of obesity, dyslipidemia and low HDL in the past. Thankfully his HDL levels have improved due to diet/exercise and statin. He still has many of the worrisome features features, only controlled with medications.Marland Kitchen

## 2013-02-14 NOTE — Assessment & Plan Note (Signed)
Currently taking metformin. His last glucose level was 100. His A1c was 6.1 showing borderline prediabetes. Would defer further adjustment to his primary physician.

## 2013-02-14 NOTE — Assessment & Plan Note (Signed)
Notable improvement with dietary modification, exercise and addition of statin. He'll be due for lipid panel check in about 6 months to a year; this has previously been followed by his primary physician.

## 2013-02-14 NOTE — Assessment & Plan Note (Signed)
Continue to encourage exercise and dietary modification. We again stressed the goal to lose 1/10 of the body weight per year. This would make it easier to keep the weight off. If he has difficulty, would consider dietary consultation.

## 2013-02-14 NOTE — Assessment & Plan Note (Signed)
Doing well no active symptoms of angina or heart failure. On a stable dose of beta blocker and ACE inhibitor, as best as able to be pushed with his blood pressure. His also on statin and DAPT (aspirin/Effient) no active signs of bleeding. Continue current therapy for now. Simply encouraged increased exercise and adjusting her diet.

## 2013-04-01 ENCOUNTER — Other Ambulatory Visit: Payer: Self-pay | Admitting: Cardiology

## 2013-04-02 ENCOUNTER — Other Ambulatory Visit: Payer: Self-pay | Admitting: Cardiology

## 2013-04-05 ENCOUNTER — Other Ambulatory Visit: Payer: Self-pay | Admitting: Cardiology

## 2013-04-08 ENCOUNTER — Other Ambulatory Visit: Payer: Self-pay | Admitting: Cardiology

## 2013-04-08 NOTE — Telephone Encounter (Signed)
Prescription for Metformin refused - defer to PCP Dr Marisue Ivan.

## 2013-05-06 ENCOUNTER — Telehealth: Payer: Self-pay | Admitting: Cardiology

## 2013-05-06 NOTE — Telephone Encounter (Signed)
Calling because [facility] is asking for a new prescription for all his medication .Marland Kitchen They are stating that they have nothing on file for him .Marland Kitchen Please call if you have any questions .Marland Kitchen   Thanks

## 2013-05-06 NOTE — Telephone Encounter (Signed)
Call to pharmacy.  Informed pt's old account was deactivated in Aug 2014 and last shipment was in Sept 2014.  Stated Rxs are still on file and can be transferred from old account to new account or new Rxs can be sent in.  Asked if they could transfer current Rxs so pt can get refills.    Informed Rxs transferred and request submitted to refill.    Returned call pt.  Left message to call back before 4pm.

## 2013-05-06 NOTE — Telephone Encounter (Signed)
Returned call.  Left message to call back before 4pm.  

## 2013-10-01 ENCOUNTER — Other Ambulatory Visit: Payer: Self-pay | Admitting: Physician Assistant

## 2013-10-01 ENCOUNTER — Other Ambulatory Visit: Payer: Self-pay | Admitting: Cardiology

## 2013-10-01 NOTE — Telephone Encounter (Signed)
Rx was sent to pharmacy electronically. 

## 2013-10-07 ENCOUNTER — Ambulatory Visit (INDEPENDENT_AMBULATORY_CARE_PROVIDER_SITE_OTHER): Payer: BC Managed Care – PPO | Admitting: Cardiology

## 2013-10-07 ENCOUNTER — Encounter: Payer: Self-pay | Admitting: Cardiology

## 2013-10-07 VITALS — BP 110/76 | HR 62 | Ht 68.0 in | Wt 307.0 lb

## 2013-10-07 DIAGNOSIS — E8881 Metabolic syndrome: Secondary | ICD-10-CM

## 2013-10-07 DIAGNOSIS — I214 Non-ST elevation (NSTEMI) myocardial infarction: Secondary | ICD-10-CM

## 2013-10-07 DIAGNOSIS — E785 Hyperlipidemia, unspecified: Secondary | ICD-10-CM

## 2013-10-07 DIAGNOSIS — I2510 Atherosclerotic heart disease of native coronary artery without angina pectoris: Secondary | ICD-10-CM

## 2013-10-07 NOTE — Patient Instructions (Signed)
Continue with current medication May use weight loss medication , DO NOT GIVE UP  Your physician wants you to follow-up in 12 month Dr Herbie BaltimoreHARDING .  You will receive a reminder letter in the mail two months in advance. If you don't receive a letter, please call our office to schedule the follow-up appointment.

## 2013-10-09 ENCOUNTER — Encounter: Payer: Self-pay | Admitting: Cardiology

## 2013-10-09 NOTE — Assessment & Plan Note (Signed)
Unfortunately all the way to the lost post MIs come back and more. He is doing all right things as far as exercise. It is he not have to adjust his dietary intake. We talked about having vegetables for snacks as opposed to doughnuts and sweets. He may very well benefit from dietary counseling.

## 2013-10-09 NOTE — Progress Notes (Signed)
Clinic Note: Patient ID: Thomas Beck, male   DOB: 07/23/1965, 48 y.o.   MRN: [id number]  PCP: Marisue IvanKIPREOS,NICHOLAS, MD Chief Complaint:  Chief Complaint  Patient presents with  . 8 MONTH VISIT    NO CHEST  PAIN, NO SOB,NO EDEMA    HPI: Thomas Beck is a 48 y.o. morbidly obese high school principal with a history of essentially Ms. STEMI/non-STEMI in May of 2014. He underwent PCI of a subtotally occluded LAD. He initially had an significantly reduced ejection fraction but improved dramatically back to almost normal at 55-60% in 4 month followup echo.. When I last saw him, he had reported dramatic lifestyle dietary and exercise habit changes. Unfortunately, in the intervening time frame he has gained most of the weight back.  Interval History: His biggest concern today is that he is adding weight is was losing weight. He states that his older he has no problem with doing his exercising routine, he just doesn't know how to adjust his diet to avoid pitfalls. From a cardiac standpoint though he denies any anginal chest tightness or pressure with rest or exertion. No exertional dyspnea. No PND, orthopnea with minimal edema.  He denies any lightheadedness, dizziness no wheezes or syncope/near-syncope. No TIA or amaurosis fugax symptoms. No melena, hematochezia or hematuria. No claudication symptoms. No claudication symptoms.  Past Medical History  Diagnosis Date  . Kidney stone   . NSTEMI (non-ST elevated myocardial infarction) - possible Missed STEMI 09/27/2012  . Coronary artery disease: 09/28/2012: 100% occluded LAD in the midportion and treated with a Promus drug eluting stent 10/06/2012  . Cardiomyopathy, ischemic: Ejection fraction 35-40% grade 2 diastolic dysfunction 10/06/2012    Resolved  . H/O echocardiogram 01/21/2013    EF 55-60%, normal WM -- normal Echo!!  . Hyperglycemia 09/27/2012  . Dyslipidemia 09/27/2012  . Metabolic syndrome - DM, HLD, Obesity 09/30/2012  . Obesity, morbid (more than  100 lbs over ideal weight or BMI > 40)    Prior Cardiac Evaluation and Past Surgical History: Reviewed  Allergies  Allergen Reactions  . Strawberry     Current Outpatient Prescriptions  Medication Sig Dispense Refill  . aspirin EC 81 MG tablet Take 1 tablet (81 mg total) by mouth daily.      Marland Kitchen. atorvastatin (LIPITOR) 80 MG tablet Take 1 tablet (80 mg total) by mouth daily at 6 PM.  90 tablet  3  . EFFIENT 10 MG TABS tablet TAKE ONE TABLET BY MOUTH ONCE DAILY  30 tablet  4  . lisinopril (PRINIVIL,ZESTRIL) 2.5 MG tablet TAKE 1 TABLET DAILY  90 tablet  1  . metFORMIN (GLUCOPHAGE) 500 MG tablet Take 1 tablet (500 mg total) by mouth 2 (two) times daily with a meal.  60 tablet  5  . metoprolol tartrate (LOPRESSOR) 25 MG tablet TAKE ONE-HALF (1/2) TABLET (12.5 MG TOTAL) TWICE A DAY  90 tablet  1  . nitroGLYCERIN (NITROSTAT) 0.4 MG SL tablet Place 1 tablet (0.4 mg total) under the tongue every 5 (five) minutes as needed for chest pain.  25 tablet  5  . pantoprazole (PROTONIX) 40 MG tablet TAKE 1 TABLET DAILY AT 12 NOON  90 tablet  1  . tamsulosin (FLOMAX) 0.4 MG CAPS Take 0.4 mg by mouth daily after supper.      . CONTRAVE 8-90 MG TB12        No current facility-administered medications for this visit.   History   Social History Narrative   He is a married high  school principal. His daughter just graduated from high school. He has not been that active up to the time of his PCI for STEMI/non-STEMI. He has picked up his level of exercise and is trying to adjust his diet.   He is using a pedometer to measure now point wall a daily basis.   ROS: A comprehensive Review of Systems - Negative except Minimal pertinent positives noted above. Otherwise negative. the major issue is his obesity. He also has significant allergies for which he uses Nasonex spray  PHYSICAL EXAM BP 110/76  Pulse 62  Ht 5\' 8"  (1.727 m)  Wt 307 lb (139.254 kg)  BMI 46.69 kg/m2 -- weight in the last encounter was 286,  --  he is put back on the 20 pounds he previously lost. General: A&Ox3, cooperative, appears stated age, no distress, morbidly obese and Very pleasant mood and affect. Answers questions appropriately. Well-groomed Neck: no carotid bruit, supple, Unable to assess for JVP base of his body habitus. Very short, thick neck Lungs: CTA B., normal percussion bilaterally and Nonlabored, good air movement. Heart: RRR, S1, S2 normal (but distant), no murmur, click, rub or gallop Abdomen: Obese; soft, NT, ND, NABS. Unable to assess HSM due to body habitus. Extremities: extremities normal, atraumatic, no cyanosis or edema, no edema, redness or tenderness in the calves or thighs and no ulcers, gangrene or trophic changes Pulses: 2+ and symmetric Neurologic: normal strength and tone. Normal symmetric reflexes. Normal coordination and gait HEENT: [place]/AT, EOMI, MMM, anicteric sclera  [id number] today: Yes Rate: 62 , Rhythm: Normal sinus;  evolving changes of anteroseptal MI, age undetermined -- Q waves in V1 through V , notably improved from his initial presentation.  Recent Labs:  Not available   ASSESSMENT/PLAN: NSTEMI (non-ST elevated myocardial infarction) - possible Missed STEMI Essentially fully recovered. No recurrent anginal symptoms. EF improved to no residual sign of anterior wall motion abnormality on echocardiogram. EKG still suggests the presence of an old anterior/septal infarct.   Coronary artery disease: 09/28/2012: 100% occluded LAD in the midportion and treated with a Promus drug eluting stent Almost one year out from his MI with PCI. He is fine with being on Effient -- stating you prefer being on that medication -- he can stop his aspirin to avoid GI bleeding and bruising. On statin, beta blocker and ACE inhibitor. Biggest issue now is weight loss.   Dyslipidemia, goal LDL below 70 On high-dose statin. Monitored by PCP. I do not have any labs since May of last year when his LDL was 166.  Goal LDL is less than 70. I would imagine it's probably not as good as expected with his obesity being an issue.  Obesity, morbid (more than 100 lbs over ideal weight or BMI > 40) Unfortunately all the way to the lost post MIs come back and more. He is doing all right things as far as exercise. It is he not have to adjust his dietary intake. We talked about having vegetables for snacks as opposed to doughnuts and sweets. He may very well benefit from dietary counseling.  Metabolic syndrome - DM, HLD, Obesity As noted before. Combination of obesity dyslipidemia and hypertension recent diagnosis of metabolic syndrome. This continues to place him at higher risk than either diagnoses along. Agree with continued metformin.    Orders Placed This Encounter  Procedures  . EKG 12-Lead    Followup: One year  Marykay Lex, M.D., M.S. [facility] [address] [place], [place]  [address]  [phone] Pager # [phone] 10/09/2013 10:23 PM

## 2013-10-09 NOTE — Assessment & Plan Note (Signed)
On high-dose statin. Monitored by PCP. I do not have any labs since May of last year when his LDL was 166. Goal LDL is less than 70. I would imagine it's probably not as good as expected with his obesity being an issue.

## 2013-10-09 NOTE — Assessment & Plan Note (Signed)
Essentially fully recovered. No recurrent anginal symptoms. EF improved to no residual sign of anterior wall motion abnormality on echocardiogram. EKG still suggests the presence of an old anterior/septal infarct.

## 2013-10-09 NOTE — Assessment & Plan Note (Signed)
As noted before. Combination of obesity dyslipidemia and hypertension recent diagnosis of metabolic syndrome. This continues to place him at higher risk than either diagnoses along. Agree with continued metformin.

## 2013-10-09 NOTE — Assessment & Plan Note (Signed)
Almost one year out from his MI with PCI. He is fine with being on Effient -- stating you prefer being on that medication -- he can stop his aspirin to avoid GI bleeding and bruising. On statin, beta blocker and ACE inhibitor. Biggest issue now is weight loss.

## 2013-11-06 ENCOUNTER — Other Ambulatory Visit: Payer: Self-pay | Admitting: Physician Assistant

## 2013-11-08 NOTE — Telephone Encounter (Signed)
Rx was sent to pharmacy electronically. 

## 2014-01-13 ENCOUNTER — Other Ambulatory Visit: Payer: Self-pay | Admitting: Cardiology

## 2014-01-13 NOTE — Telephone Encounter (Signed)
Rx was sent to pharmacy electronically. 

## 2014-03-11 ENCOUNTER — Other Ambulatory Visit: Payer: Self-pay | Admitting: Cardiology

## 2014-03-12 NOTE — Telephone Encounter (Signed)
Rx was sent to pharmacy electronically. 

## 2014-04-04 ENCOUNTER — Other Ambulatory Visit: Payer: Self-pay | Admitting: Cardiology

## 2014-04-04 NOTE — Telephone Encounter (Signed)
Rx refill sent to patient pharmacy   

## 2014-04-12 ENCOUNTER — Other Ambulatory Visit: Payer: Self-pay | Admitting: Cardiology

## 2014-04-12 NOTE — Telephone Encounter (Signed)
Rx was sent to pharmacy electronically. 

## 2014-04-28 ENCOUNTER — Encounter (HOSPITAL_COMMUNITY): Payer: Self-pay | Admitting: Cardiology

## 2014-09-20 ENCOUNTER — Other Ambulatory Visit: Payer: Self-pay | Admitting: Cardiology

## 2014-09-25 ENCOUNTER — Other Ambulatory Visit: Payer: Self-pay | Admitting: Cardiology

## 2014-09-26 NOTE — Telephone Encounter (Signed)
Rx(s) sent to pharmacy electronically.  

## 2015-01-13 ENCOUNTER — Ambulatory Visit: Payer: Self-pay | Admitting: Cardiology

## 2015-03-28 ENCOUNTER — Other Ambulatory Visit: Payer: Self-pay | Admitting: Cardiology

## 2015-03-29 ENCOUNTER — Other Ambulatory Visit: Payer: Self-pay | Admitting: Cardiology

## 2015-03-31 ENCOUNTER — Other Ambulatory Visit: Payer: Self-pay | Admitting: Cardiology

## 2015-04-29 ENCOUNTER — Other Ambulatory Visit: Payer: Self-pay | Admitting: Cardiology

## 2015-05-01 ENCOUNTER — Other Ambulatory Visit: Payer: Self-pay | Admitting: Cardiology

## 2015-05-01 NOTE — Telephone Encounter (Signed)
REFILL 

## 2015-05-12 ENCOUNTER — Other Ambulatory Visit: Payer: Self-pay | Admitting: Cardiology

## 2015-05-12 NOTE — Telephone Encounter (Signed)
Rx request sent to pharmacy.  

## 2015-06-02 ENCOUNTER — Other Ambulatory Visit: Payer: Self-pay | Admitting: Cardiology

## 2015-06-04 ENCOUNTER — Other Ambulatory Visit: Payer: Self-pay | Admitting: Cardiology

## 2015-06-20 ENCOUNTER — Ambulatory Visit (INDEPENDENT_AMBULATORY_CARE_PROVIDER_SITE_OTHER): Payer: BLUE CROSS/BLUE SHIELD | Admitting: Cardiology

## 2015-06-20 ENCOUNTER — Encounter: Payer: Self-pay | Admitting: Cardiology

## 2015-06-20 VITALS — BP 130/90 | HR 84 | Ht 68.0 in | Wt 311.0 lb

## 2015-06-20 DIAGNOSIS — I2510 Atherosclerotic heart disease of native coronary artery without angina pectoris: Secondary | ICD-10-CM

## 2015-06-20 DIAGNOSIS — R739 Hyperglycemia, unspecified: Secondary | ICD-10-CM

## 2015-06-20 DIAGNOSIS — E785 Hyperlipidemia, unspecified: Secondary | ICD-10-CM

## 2015-06-20 DIAGNOSIS — E8881 Metabolic syndrome: Secondary | ICD-10-CM

## 2015-06-20 DIAGNOSIS — I214 Non-ST elevation (NSTEMI) myocardial infarction: Secondary | ICD-10-CM | POA: Diagnosis not present

## 2015-06-20 DIAGNOSIS — Z9861 Coronary angioplasty status: Secondary | ICD-10-CM

## 2015-06-20 MED ORDER — CLOPIDOGREL BISULFATE 75 MG PO TABS: 75.0000 mg | ORAL_TABLET | Freq: Every day | ORAL | Status: DC

## 2015-06-20 MED ORDER — NITROGLYCERIN 0.4 MG SL SUBL: 0.4000 mg | SUBLINGUAL_TABLET | SUBLINGUAL | Status: DC | PRN

## 2015-06-20 NOTE — Assessment & Plan Note (Signed)
Now on metformin. I'm not sure what his recent A1c was, he may have met criteria now for diabetes.

## 2015-06-20 NOTE — Patient Instructions (Signed)
STOP EFFIENT AFTER YOUR COMPLETED,START CLOPIDOGREL 75 MG ONE TABLET DAILY.  No other changes at present.    Your physician wants you to follow-up in 12 MONTHS WITH Dr Herbie Baltimore.  You will receive a reminder letter in the mail two months in advance. If you don't receive a letter, please call our office to schedule the follow-up appointment.  If you need a refill on your cardiac medications before your next appointment, please call your pharmacy.

## 2015-06-20 NOTE — Assessment & Plan Note (Signed)
Doing quite well with almost full recovery. EF back to normal. He does have anterior Q waves but suggests possible old anterior MI. No active angina or heart failure symptoms. No regional wall motion on follow-up echo.

## 2015-06-20 NOTE — Assessment & Plan Note (Signed)
Combination of obesity, low HDL and diabetes. This provides an increased risk of either risk factor alone. Needs aggressive monitoring of his lipids and blood pressure as well as glycemic control. More importantly needs to lose weight with diet and exercise.

## 2015-06-20 NOTE — Progress Notes (Signed)
PCP: Madaline Brilliant, MD  Clinic Note: Chief Complaint  Patient presents with  . Follow-up    no chest pain, no shortness of breath, no swelling, some cramping, no dizziness or lightheadedness  . Coronary Artery Disease  . Obesity    HPI: Thomas Beck is a 50 y.o. male with a PMH below who presents today for 18 month follow-up of coronary disease - anterior NSTEMI in May 2014. He had PCI to subtotal occluded LAD at the time. Initial ischemic cardiomyopathy resolved with a final EF of 55-60% with a follow-up echo. In addition to his coronary disease the most notable issue with cath has been his obesity. He lost weight initially with Lysol modifications, but then gained a lot of weight back.  Mckoy Bhakta was last seen in May 2015 -he was exercising, but was not having good success with his dietary modification. PCP changed from Lopressor to Toprol - tolerating better.  Recent Hospitalizations:   Studies Reviewed: None  Interval History: Unfortunately, his weight shot back up with December PCP check (327). Was started on a food journal & is trying hard to adjust his diet.  Despite best intentions, has not gotten into exercise routine like he should -- only ~2 d/week of good walking exercise.   He has been essentially asymptomatic from a Cardiovascular standpoint.  No chest pain or shortness of breath with rest or exertion. No PND, orthopnea or edema. No palpitations, lightheadedness, dizziness, weakness or syncope/near syncope. No TIA/amaurosis fugax symptoms. No melena, hematochezia, or epstaxis. No claudication.  Currently on Rx for Nephrolithiasis related UTI -- did have some hematuria associated with that.  ROS: A comprehensive was performed. Review of Systems  Constitutional: Negative for fever, chills and malaise/fatigue.       No more F/C since starting Rx for UTI  Eyes: Negative for blurred vision.  Respiratory: Negative for cough, shortness of breath and wheezing.     Gastrointestinal: Negative for heartburn, blood in stool and melena.  Genitourinary: Positive for dysuria and hematuria.  Musculoskeletal:       Rare cramps  Neurological: Negative for dizziness, loss of consciousness, weakness and headaches.  Endo/Heme/Allergies: Does not bruise/bleed easily.  Psychiatric/Behavioral: Negative.   All other systems reviewed and are negative.   Past Medical History  Diagnosis Date  . Kidney stone   . NSTEMI (non-ST elevated myocardial infarction) - possible Missed STEMI 09/27/2012  . Coronary artery disease: 09/28/2012: 100% occluded LAD in the midportion and treated with a Promus drug eluting stent 10/06/2012  . Cardiomyopathy, ischemic: Ejection fraction 13-24% grade 2 diastolic dysfunction 08/19/270    Resolved  . H/O echocardiogram 01/21/2013    EF 55-60%, normal WM -- normal Echo!!  . Hyperglycemia 09/27/2012  . Dyslipidemia 09/27/2012  . Metabolic syndrome - DM, HLD, Obesity 09/30/2012  . Obesity, morbid (more than 100 lbs over ideal weight or BMI > 40) (HCC)     Past Surgical History  Procedure Laterality Date  . Cardiac catheterization  09/28/2012    Severe 1VCAD --100% mid LAD occlusion just after Mod-large D1 with a ~40% ostial/proximal stenosis;  . Coronary angioplasty with stent placement  09/28/2012    Mid LAD: Promus Premier DES 3.0 mm x 28 mm (post-dilated to 3.4 mm proximal, 3.3 mm mid & 3.15 mm distal)  . Doppler echocardiography  09/27/2012    EF 35-40%, moderately reduced. Severe hypokinesis of the mid to distal anteroseptal, anterior and apical myocardium consistent LAD infarction. Grade 2 diastolic dysfunction.  . Doppler echocardiography  01/2013    EF 55-60%, no regional WMA,   . Left heart catheterization with coronary angiogram N/A 09/28/2012    Procedure: LEFT HEART CATHETERIZATION WITH CORONARY ANGIOGRAM;  Surgeon: Leonie Man, MD;  Location[facility] LAB;  Service: Cardiovascular;  Laterality: N/A;  . Percutaneous coronary  stent intervention (pci-s)  09/28/2012    Procedure: PERCUTANEOUS CORONARY STENT INTERVENTION (PCI-S);  Surgeon: Leonie Man, MD;  Location: [facility] CATH LAB;  Service: Cardiovascular;;    Prior to Admission medications   Medication Sig Start Date End Date Taking? Authorizing Provider  atorvastatin (LIPITOR) 80 MG tablet Take 1 tablet (80 mg total) by mouth daily at 6 PM. NEEDS APPOINTMENT FOR FUTURE REFILLS 09/26/14  Yes Leonie Man, MD  EFFIENT 10 MG TABS tablet TAKE ONE TABLET BY MOUTH ONCE DAILY 06/05/15  Yes Leonie Man, MD  lisinopril (PRINIVIL,ZESTRIL) 2.5 MG tablet TAKE 1 TABLET DAILY 05/12/15  Yes Leonie Man, MD  metFORMIN (GLUCOPHAGE) 500 MG tablet Take 1 tablet (500 mg total) by mouth 2 (two) times daily with a meal. 09/30/12  Yes Brittainy Erie Noe, PA-C  metoprolol succinate (TOPROL-XL) 25 MG 24 hr tablet Take by mouth. 05/17/15 05/16/16 Yes Historical Provider, MD  nitroGLYCERIN (NITROSTAT) 0.4 MG SL tablet Place 1 tablet (0.4 mg total) under the tongue every 5 (five) minutes as needed for chest pain. MAX 3 DOSES 01/13/14  Yes Leonie Man, MD  pantoprazole (PROTONIX) 40 MG tablet TAKE 1 TABLET DAILY AT 12 NOON 04/04/14  Yes Leonie Man, MD  tamsulosin (FLOMAX) 0.4 MG CAPS Take 0.4 mg by mouth daily after supper.   Yes Historical Provider, MD    Allergies  Allergen Reactions  . Strawberry Extract      Social History   Social History  . Marital Status: Married    Spouse Name: N/A  . Number of Children: N/A  . Years of Education: N/A   Occupational History  . [employer]   Social History Main Topics  . Smoking status: Never Smoker   . Smokeless tobacco: None  . Alcohol Use: Yes     Comment: seldom  . Drug Use: None  . Sexual Activity: Not Asked   Other Topics Concern  . 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 He has not been that active up to the  time of his PCI for STEMI/non-STEMI. He has picked up his level of exercise and is trying to adjust his diet.   He is using a pedometer to measure now point wall a daily basis.    Family History  Problem Relation Age of Onset  . Sudden death Other     Wt Readings from Last 3 Encounters:  06/20/15 311 lb (141.069 kg)  10/07/13 307 lb (139.254 kg)  02/09/13 286 lb 14.4 oz (130.137 kg)  - this is actually 16 lb down from December PCP visit.  PHYSICAL EXAM BP 130/90 mmHg  Pulse 84  Ht _0  (1.727 m)  Wt 311 lb (141.069 kg)  BMI 47.30 kg/m2 General: A&Ox3, cooperative, appears stated age, no distress, morbidly obese and Very pleasant mood and affect. Answers questions appropriately. Well-groomed Neck: no carotid bruit, supple, Unable to assess for JVP base of his body habitus. Very short, thick neck Lungs: CTA B., normal percussion bilaterally and Nonlabored, good air movement. Heart: RRR, S1, S2 normal (but distant),  no murmur, click, rub or gallop Abdomen: Obese; soft, NT, ND, NABS. Unable to assess HSM due to body habitus. Extremities: extremities normal, atraumatic, no cyanosis or edema, no edema, redness or tenderness in the calves or thighs and no ulcers, gangrene or trophic changes Pulses: 2+ and symmetric Neurologic: normal strength and tone. Normal symmetric reflexes. Normal coordination and gait HEENT: [place]/AT, EOMI, MMM, anicteric sclera    Adult ECG Report  Rate: 84 ;  Rhythm: normal sinus rhythm; Anteroseptal MI, age undetermined   Narrative Interpretation: stable EKG - no change from previous study.   Other studies Reviewed: Additional studies/ records that were reviewed today include:  Recent Labs:   These have been monitored by his PCP. No labs available - just checked last week.   ASSESSMENT / PLAN: Problem List Items Addressed This Visit    Obesity, morbid (more than 100 lbs over ideal weight or BMI > 40) (HCC) (Chronic)    He apparently is working with his PCP  with a food diary. He also really needs to increase level activity. 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 He needs to watch his diet, and stick with it. He gotten down to about 280 post MI.      Relevant Orders   EKG 12-Lead   NSTEMI (non-ST elevated myocardial infarction) - possible Missed STEMI - Primary (Chronic)    Doing quite well with almost full recovery. EF back to normal. He does have anterior Q waves but suggests possible old anterior MI. No active angina or heart failure symptoms. No regional wall motion on follow-up echo.      Relevant Medications   metoprolol succinate (TOPROL-XL) 25 MG 24 hr tablet   nitroGLYCERIN (NITROSTAT) 0.4 MG SL tablet   Other Relevant Orders   EKG 75-ZWCH   Metabolic syndrome - DM, HLD, Obesity (Chronic)    Combination of obesity, low HDL and diabetes. This provides an increased risk of either risk factor alone. Needs aggressive monitoring of his lipids and blood pressure as well as glycemic control. More importantly needs to lose weight with diet and exercise.      Relevant Orders   EKG 12-Lead   Hyperglycemia (Chronic)    Now on metformin. I'm not sure what his recent A1c was, he may have met criteria now for diabetes.      Relevant Orders   EKG 12-Lead   Dyslipidemia, goal LDL below 70 (Chronic)    He fairly just had labs checked by his PCP. Not aware of the results. He remains on high-dose atorvastatin.      Relevant Medications   metoprolol succinate (TOPROL-XL) 25 MG 24 hr tablet   nitroGLYCERIN (NITROSTAT) 0.4 MG SL tablet   Other Relevant Orders   EKG 12-Lead   Coronary artery disease: 09/28/2012: 100% occluded midLAD - PCI w/ Promus drug eluting stent (Chronic)    He is now 2-1/2 years following his MI. I think we can switch from Effient to Plavix with no aspirin. He is on high-dose statin,  low-dose ACE inhibitor and beta blocker. Tolerating Toprol better than Lopressor which is more appropriate for ischemic cardiomyopathy anyway.      Relevant Medications   metoprolol succinate (TOPROL-XL) 25 MG 24 hr tablet   nitroGLYCERIN (NITROSTAT) 0.4 MG SL tablet   Other Relevant Orders   EKG  12-Lead      Current medicines are reviewed at length with the patient today. (+/- concerns) N/A The following changes have been made:  After current Effient Rx - change to Plavix.  Ok to hold for 2-3 days for bleeding.  Studies Ordered:   Orders Placed This Encounter  Procedures  . EKG 12-Lead    ROV 1 yr.   Leonie Man, M.D., M.S. Interventional Cardiologist   Pager # [phone] Phone # [phone] [address] [place], [place] [address]

## 2015-06-20 NOTE — Assessment & Plan Note (Signed)
He fairly just had labs checked by his PCP. Not aware of the results. He remains on high-dose atorvastatin.

## 2015-06-20 NOTE — Assessment & Plan Note (Signed)
He is now 2-1/2 years following his MI. I think we can switch from Effient to Plavix with no aspirin. He is on high-dose statin, low-dose ACE inhibitor and beta blocker. Tolerating Toprol better than Lopressor which is more appropriate for ischemic cardiomyopathy anyway.

## 2015-06-20 NOTE — Assessment & Plan Note (Signed)
He apparently is working with his PCP with a food diary. He also really needs to increase level activity. 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 He needs to watch his diet, and stick with it. He gotten down to about 280 post MI.

## 2015-06-26 ENCOUNTER — Other Ambulatory Visit: Payer: Self-pay | Admitting: Cardiology

## 2016-02-06 ENCOUNTER — Other Ambulatory Visit: Payer: Self-pay | Admitting: Cardiology

## 2016-02-06 NOTE — Telephone Encounter (Signed)
Rx has been sent to the pharmacy electronically. ° °

## 2016-08-04 ENCOUNTER — Other Ambulatory Visit: Payer: Self-pay | Admitting: Cardiology

## 2016-08-08 ENCOUNTER — Other Ambulatory Visit: Payer: Self-pay | Admitting: Cardiology

## 2016-10-29 ENCOUNTER — Other Ambulatory Visit: Payer: Self-pay | Admitting: Cardiology

## 2017-02-01 ENCOUNTER — Other Ambulatory Visit: Payer: Self-pay | Admitting: Cardiology

## 2017-02-03 NOTE — Telephone Encounter (Signed)
Rx(s) sent to pharmacy electronically.  

## 2017-10-04 ENCOUNTER — Inpatient Hospital Stay (HOSPITAL_COMMUNITY)
Admission: EM | Admit: 2017-10-04 | Discharge: 2017-10-07 | DRG: 247 | Disposition: A | Discharge status: Home / Self Care | Payer: 59 | Attending: Cardiology | Admitting: Cardiology

## 2017-10-04 ENCOUNTER — Emergency Department (HOSPITAL_COMMUNITY): Payer: 59

## 2017-10-04 ENCOUNTER — Other Ambulatory Visit: Payer: Self-pay

## 2017-10-04 ENCOUNTER — Encounter (HOSPITAL_COMMUNITY): Payer: Self-pay | Admitting: Emergency Medicine

## 2017-10-04 DIAGNOSIS — R079 Chest pain, unspecified: Secondary | ICD-10-CM | POA: Diagnosis not present

## 2017-10-04 DIAGNOSIS — I255 Ischemic cardiomyopathy: Secondary | ICD-10-CM | POA: Diagnosis present

## 2017-10-04 DIAGNOSIS — Z91048 Other nonmedicinal substance allergy status: Secondary | ICD-10-CM

## 2017-10-04 DIAGNOSIS — I10 Essential (primary) hypertension: Secondary | ICD-10-CM | POA: Diagnosis present

## 2017-10-04 DIAGNOSIS — I252 Old myocardial infarction: Secondary | ICD-10-CM

## 2017-10-04 DIAGNOSIS — R072 Precordial pain: Secondary | ICD-10-CM | POA: Diagnosis not present

## 2017-10-04 DIAGNOSIS — E8881 Metabolic syndrome: Secondary | ICD-10-CM | POA: Diagnosis present

## 2017-10-04 DIAGNOSIS — Z79899 Other long term (current) drug therapy: Secondary | ICD-10-CM

## 2017-10-04 DIAGNOSIS — Z6841 Body Mass Index (BMI) 40.0 and over, adult: Secondary | ICD-10-CM

## 2017-10-04 DIAGNOSIS — E119 Type 2 diabetes mellitus without complications: Secondary | ICD-10-CM | POA: Diagnosis present

## 2017-10-04 DIAGNOSIS — Z955 Presence of coronary angioplasty implant and graft: Secondary | ICD-10-CM

## 2017-10-04 DIAGNOSIS — Z7902 Long term (current) use of antithrombotics/antiplatelets: Secondary | ICD-10-CM

## 2017-10-04 DIAGNOSIS — Z8241 Family history of sudden cardiac death: Secondary | ICD-10-CM

## 2017-10-04 DIAGNOSIS — I25110 Atherosclerotic heart disease of native coronary artery with unstable angina pectoris: Secondary | ICD-10-CM

## 2017-10-04 DIAGNOSIS — I959 Hypotension, unspecified: Secondary | ICD-10-CM | POA: Diagnosis not present

## 2017-10-04 DIAGNOSIS — Z87442 Personal history of urinary calculi: Secondary | ICD-10-CM

## 2017-10-04 DIAGNOSIS — E785 Hyperlipidemia, unspecified: Secondary | ICD-10-CM | POA: Diagnosis present

## 2017-10-04 HISTORY — DX: Atherosclerotic heart disease of native coronary artery without angina pectoris: I25.10

## 2017-10-04 HISTORY — DX: Coronary angioplasty status: Z98.61

## 2017-10-04 LAB — BASIC METABOLIC PANEL
Anion gap: 10 (ref 5–15)
BUN: 12 mg/dL (ref 6–20)
CHLORIDE: 105 mmol/L (ref 101–111)
CO2: 23 mmol/L (ref 22–32)
CREATININE: 1.25 mg/dL — AB (ref 0.61–1.24)
Calcium: 9 mg/dL (ref 8.9–10.3)
GFR calc Af Amer: >60 mL/min (ref >60)
GFR calc non Af Amer: >60 mL/min (ref >60)
Glucose, Bld: 111 mg/dL — ABNORMAL HIGH (ref 65–99)
Potassium: 4.4 mmol/L (ref 3.5–5.1)
SODIUM: 138 mmol/L (ref 135–145)

## 2017-10-04 LAB — CBC
HCT: 47.4 % (ref 39.0–52.0)
Hemoglobin: 15.5 g/dL (ref 13.0–17.0)
MCH: 28.7 pg (ref 26.0–34.0)
MCHC: 32.7 g/dL (ref 30.0–36.0)
MCV: 87.6 fL (ref 78.0–100.0)
Platelets: 284 10*3/uL (ref 150–400)
RBC: 5.41 MIL/uL (ref 4.22–5.81)
RDW: 12.4 % (ref 11.5–15.5)
WBC: 8.4 10*3/uL (ref 4.0–10.5)

## 2017-10-04 LAB — I-STAT TROPONIN, ED
TROPONIN I, POC: 0.07 ng/mL (ref 0.00–0.08)
Troponin i, poc: 0.05 ng/mL (ref 0.00–0.08)

## 2017-10-04 MED ORDER — ATORVASTATIN CALCIUM 40 MG PO TABS
40.0000 mg | ORAL_TABLET | Freq: Every day | ORAL | Status: DC
  Administered 2017-10-05 – 2017-10-06 (×2): 40 mg via ORAL
  Filled 2017-10-04 (×3): qty 1

## 2017-10-04 MED ORDER — ZOLPIDEM TARTRATE 5 MG PO TABS: 2.5000 mg | ORAL_TABLET | Freq: Every evening | ORAL | Status: DC | PRN

## 2017-10-04 MED ORDER — TAMSULOSIN HCL 0.4 MG PO CAPS
0.4000 mg | ORAL_CAPSULE | Freq: Every day | ORAL | Status: DC
  Administered 2017-10-05 – 2017-10-06 (×2): 0.4 mg via ORAL
  Filled 2017-10-04 (×2): qty 1

## 2017-10-04 MED ORDER — PANTOPRAZOLE SODIUM 40 MG PO TBEC
40.0000 mg | DELAYED_RELEASE_TABLET | Freq: Every day | ORAL | Status: DC
  Administered 2017-10-04 – 2017-10-06 (×3): 40 mg via ORAL
  Filled 2017-10-04 (×4): qty 1

## 2017-10-04 MED ORDER — ACETAMINOPHEN 325 MG PO TABS: 650.0000 mg | ORAL_TABLET | ORAL | Status: DC | PRN

## 2017-10-04 MED ORDER — ASPIRIN 81 MG PO CHEW
324.0000 mg | CHEWABLE_TABLET | Freq: Once | ORAL | Status: AC
  Administered 2017-10-04: 324 mg via ORAL
  Filled 2017-10-04: qty 4

## 2017-10-04 MED ORDER — LISINOPRIL 5 MG PO TABS
2.5000 mg | ORAL_TABLET | Freq: Every day | ORAL | Status: DC
  Administered 2017-10-05 – 2017-10-07 (×3): 2.5 mg via ORAL
  Filled 2017-10-04 (×3): qty 1

## 2017-10-04 MED ORDER — INSULIN ASPART 100 UNIT/ML ~~LOC~~ SOLN
0.0000 [IU] | Freq: Three times a day (TID) | SUBCUTANEOUS | Status: DC
Start: 2017-10-05 — End: 2017-10-07
  Administered 2017-10-06: 21:00:00 2 [IU] via SUBCUTANEOUS

## 2017-10-04 MED ORDER — METOPROLOL SUCCINATE ER 25 MG PO TB24
25.0000 mg | ORAL_TABLET | Freq: Every day | ORAL | Status: DC
  Administered 2017-10-05 – 2017-10-07 (×3): 25 mg via ORAL
  Filled 2017-10-04 (×4): qty 1

## 2017-10-04 MED ORDER — CLOPIDOGREL BISULFATE 75 MG PO TABS
75.0000 mg | ORAL_TABLET | Freq: Every day | ORAL | Status: DC
  Administered 2017-10-05 – 2017-10-07 (×3): 75 mg via ORAL
  Filled 2017-10-04 (×3): qty 1

## 2017-10-04 NOTE — ED Notes (Signed)
Pt is ambulating in Pod w/ sps.

## 2017-10-04 NOTE — H&P (Addendum)
History and Physical  Primary Cardiologist:Harding PCP: Madaline Brilliant, MD  Chief Complaint: Chest Pain  HPI:  Thomas Beck is a 52 year old gentleman with history of CAD status post LAD STEMI in 2014 with resulting ischemic cardiomyopathy which later improved, diabetes, obesity, hyperlipidemia presenting with acute onset of several episodes of chest pain today.  Last week, he notes that he had been off all antiplatelets for about 5 or 6 days due to confusion about what to take.  Today, he awoke and while walking to get his dog experienced escalating chest pressure for several hours.  This is very rare for him and the first time he had any symptoms after his STEMI.  He eventually took a sublingual nitroglycerin which eased off his symptoms.  Later at a softball game while he was sitting he had recurrent symptoms at rest which was also nitro responsive.  This led him to come to the emergency room.  No recurrent symptoms since then and troponins have been negative.  Globally, he denies any other concerning signs or symptoms including dyspnea, bleeding troubles, edema.  He is concerned given that chest pain is very rare for him.  Prior Cardiac Studies: Echo 01/2013 Study Conclusions  - Left ventricle: The cavity size was normal. There was mild concentric hypertrophy. Systolic function was normal. The estimated ejection fraction was in the range of 55% to 60%. Wall motion was normal; there were no regional wall motion abnormalities. Left ventricular diastolic function parameters were normal. - Left atrium: LA Volume/BSA 24.8 ml/m2. The atrium was normal in size. - Inferior vena cava: The vessel was normal in size; the respirophasic diameter changes were in the normal range (= 50%); findings are consistent with normal central venous pressure.  Past Medical History:  Diagnosis Date  . Cardiomyopathy, ischemic: Ejection fraction 53-00% grade 2 diastolic dysfunction  09/27/209   Resolved  . Coronary artery disease: 09/28/2012: 100% occluded LAD in the midportion and treated with a Promus drug eluting stent 10/06/2012  . Dyslipidemia 09/27/2012  . H/O echocardiogram 01/21/2013   EF 55-60%, normal WM -- normal Echo!!  . Hyperglycemia 09/27/2012  . Kidney stone   . Metabolic syndrome - DM, HLD, Obesity 09/30/2012  . NSTEMI (non-ST elevated myocardial infarction) - possible Missed STEMI 09/27/2012  . Obesity, morbid (more than 100 lbs over ideal weight or BMI > 40) (HCC)     Past Surgical History:  Procedure Laterality Date  . CARDIAC CATHETERIZATION  09/28/2012   Severe 1VCAD --100% mid LAD occlusion just after Mod-large D1 with a ~40% ostial/proximal stenosis;  . CORONARY ANGIOPLASTY WITH STENT PLACEMENT  09/28/2012   Mid LAD: Promus Premier DES 3.0 mm x 28 mm (post-dilated to 3.4 mm proximal, 3.3 mm mid & 3.15 mm distal)  . DOPPLER ECHOCARDIOGRAPHY  09/27/2012   EF 35-40%, moderately reduced. Severe hypokinesis of the mid to distal anteroseptal, anterior and apical myocardium consistent LAD infarction. Grade 2 diastolic dysfunction.  . DOPPLER ECHOCARDIOGRAPHY  01/2013   EF 55-60%, no regional WMA,   . LEFT HEART CATHETERIZATION WITH CORONARY ANGIOGRAM N/A 09/28/2012   Procedure: LEFT HEART CATHETERIZATION WITH CORONARY ANGIOGRAM;  Surgeon: Leonie Man, MD;  Location: [facility] CATH LAB;  Service: Cardiovascular;  Laterality: N/A;  . PERCUTANEOUS CORONARY STENT INTERVENTION (PCI-S)  09/28/2012   Procedure: PERCUTANEOUS CORONARY STENT INTERVENTION (PCI-S);  Surgeon: Leonie Man, MD;  Location: [facility] CATH LAB;  Service: Cardiovascular;;    Family History  Problem Relation Age of Onset  . Sudden death Other  Social History:  reports that he has never smoked. He has never used smokeless tobacco. He reports that he drinks alcohol. His drug history is not on file.  Allergies:  Allergies  Allergen Reactions  . Strawberry Extract Itching    No current  facility-administered medications on file prior to encounter.    Current Outpatient Medications on File Prior to Encounter  Medication Sig Dispense Refill  . atorvastatin (LIPITOR) 80 MG tablet Take 1 tablet (80 mg total) by mouth daily at 6 PM. NEEDS APPOINTMENT FOR FUTURE REFILLS (Patient taking differently: Take 40 mg by mouth daily at 6 PM. NEEDS APPOINTMENT FOR FUTURE REFILLS) 30 tablet 0  . clopidogrel (PLAVIX) 75 MG tablet TAKE 1 TABLET BY MOUTH ONCE DAILY NEEDS  APPOINTMENT  FOR  ADDITIONAL  REFILLS 90 tablet 0  . lisinopril (PRINIVIL,ZESTRIL) 2.5 MG tablet Take 1 tablet (2.5 mg total) by mouth daily. <PLEASE MAKE APPOINTMENT FOR REFILLS> 30 tablet 0  . metFORMIN (GLUCOPHAGE) 500 MG tablet Take 1 tablet (500 mg total) by mouth 2 (two) times daily with a meal. 60 tablet 5  . metoprolol succinate (TOPROL-XL) 25 MG 24 hr tablet Take 25 mg by mouth daily.    . nitroGLYCERIN (NITROSTAT) 0.4 MG SL tablet Place 1 tablet (0.4 mg total) under the tongue every 5 (five) minutes as needed for chest pain. MAX 3 DOSES 25 tablet 3  . pantoprazole (PROTONIX) 40 MG tablet TAKE 1 TABLET DAILY AT 12:00 NOON 90 tablet 3  . tamsulosin (FLOMAX) 0.4 MG CAPS Take 0.4 mg by mouth daily after supper.    . metoprolol succinate (TOPROL-XL) 25 MG 24 hr tablet Take by mouth.      Results for orders placed or performed during the hospital encounter of 10/04/17 (from the past 48 hour(s))  Basic metabolic panel     Status: Abnormal   Collection Time: 10/04/17  2:34 PM  Result Value Ref Range   Sodium 138 135 - 145 mmol/L   Potassium 4.4 3.5 - 5.1 mmol/L   Chloride 105 101 - 111 mmol/L   CO2 23 22 - 32 mmol/L   Glucose, Bld 111 (H) 65 - 99 mg/dL   BUN 12 6 - 20 mg/dL   Creatinine, Ser 1.25 (H) 0.61 - 1.24 mg/dL   Calcium 9.0 8.9 - 10.3 mg/dL   GFR calc non Af Amer >60 >60 mL/min   GFR calc Af Amer >60 >60 mL/min    Comment: (NOTE) The eGFR has been calculated using the CKD EPI equation. This calculation has not  been validated in all clinical situations. eGFR's persistently <60 mL/min signify possible Chronic Kidney Disease.    Anion gap 10 5 - 15    Comment: Performed at [facility] [address] [place] [address]  CBC     Status: None   Collection Time: 10/04/17  2:34 PM  Result Value Ref Range   WBC 8.4 4.0 - 10.5 K/uL   RBC 5.41 4.22 - 5.81 MIL/uL   Hemoglobin 15.5 13.0 - 17.0 g/dL   HCT 47.4 39.0 - 52.0 %   MCV 87.6 78.0 - 100.0 fL   MCH 28.7 26.0 - 34.0 pg   MCHC 32.7 30.0 - 36.0 g/dL   RDW 12.4 11.5 - 15.5 %   Platelets 284 150 - 400 K/uL    Comment: Performed at [facility] [address], [place], [place] [address]  I-stat troponin, ED     Status: None  Collection Time: 10/04/17  2:50 PM  Result Value Ref Range   Troponin i, poc 0.05 0.00 - 0.08 ng/mL   Comment 3            Comment: Due to the release kinetics of cTnI, a negative result within the first hours of the onset of symptoms does not rule out myocardial infarction with certainty. If myocardial infarction is still suspected, repeat the test at appropriate intervals.   I-stat troponin, ED     Status: None   Collection Time: 10/04/17  7:33 PM  Result Value Ref Range   Troponin i, poc 0.07 0.00 - 0.08 ng/mL   Comment 3            Comment: Due to the release kinetics of cTnI, a negative result within the first hours of the onset of symptoms does not rule out myocardial infarction with certainty. If myocardial infarction is still suspected, repeat the test at appropriate intervals.    Dg Chest 2 View  Result Date: 10/04/2017 CLINICAL DATA:  Pt is having chest pains on left side says he had M.I 5 years ago and has a stent put in , pain started early today says he took two nitro. EXAM: CHEST - 2 VIEW COMPARISON:  None. FINDINGS: Normal mediastinum and cardiac silhouette. Normal pulmonary vasculature. No evidence of effusion, infiltrate, or pneumothorax. No acute bony abnormality. Thin post  processing line artifact bisects the radiograph top-to-bottom. IMPRESSION: No acute cardiopulmonary process. Electronically Signed   By: Suzy Bouchard M.D.   On: 10/04/2017 15:51    ECG/Tele: Normal sinus rhythm with old anterior MI changes without acute ischemic changes  ROS: As above. Otherwise, review of systems is negative unless per above HPI  Vitals:   10/04/17 1915 10/04/17 1922 10/04/17 2000 10/04/17 2045  BP: 104/66  110/69 104/63  Pulse: 64  83 61  Resp: 17  (!) 27 13  Temp:      TempSrc:      SpO2: 97% 98% 98% 98%  Weight:      Height:       Wt Readings from Last 10 Encounters:  10/04/17 (!) 144.7 kg (319 lb)  06/20/15 (!) 141.1 kg (311 lb)  10/07/13 (!) 139.3 kg (307 lb)  02/09/13 130.1 kg (286 lb 14.4 oz)  11/04/12 129.8 kg (286 lb 1.6 oz)  10/06/12 130.2 kg (287 lb)  09/30/12 131.7 kg (290 lb 5.5 oz)    PE:  General: No acute distress HEENT: Atraumatic, EOMI, mucous membranes moist. No JVD at 45 degrees. No HJR. CV: RRR no murmurs, gallops.  Respiratory: Clear, no crackles. Normal work of breathing ABD: Obese non-tender. No palpable organomegaly.  Extremities: 2+ radial pulses bilaterally. trace edema. Neuro/Psych: CN grossly intact, alert and oriented  Assessment/Plan Chest Pain at rest x 2 HTN HLD DM  Patient is high risk given previous CAD, medical comorbidities and last week being off all antiplatelet therapies due to confusion about what to take.  Currently pain free and trop negative x2. Normally he takes monotherapy clopidogrel.  We will admit him for overnight observation and repeat echocardiogram in the morning.  - S/P 325 ASA - TTE ordered - Tele bed, cycle troponin - Continue home atorva, plavix, lisinopril, toprol. - Holding metformin  Addendum: Troponin later returned mildly positive, will start ACS heparin drip  Lolita Cram Kyonna Frier  MD 10/04/2017, 10:51 PM

## 2017-10-04 NOTE — ED Triage Notes (Signed)
Patient presents to ED for assessment of left sided chest pain, radiating to left armpit.  C/o some SOB and some light-headedness.  States hx of NSTEMI with similar symptoms 5 years ago.  Patient took a nitro at 8am this morning with resolution of the pain.  Returned around 11am, took another nitro and it is current just 3/10 pressure to his armpit (no chest pain currently).

## 2017-10-05 ENCOUNTER — Other Ambulatory Visit: Payer: Self-pay

## 2017-10-05 DIAGNOSIS — I5030 Unspecified diastolic (congestive) heart failure: Secondary | ICD-10-CM | POA: Diagnosis not present

## 2017-10-05 DIAGNOSIS — I252 Old myocardial infarction: Secondary | ICD-10-CM | POA: Diagnosis not present

## 2017-10-05 DIAGNOSIS — Z87442 Personal history of urinary calculi: Secondary | ICD-10-CM | POA: Diagnosis not present

## 2017-10-05 DIAGNOSIS — E119 Type 2 diabetes mellitus without complications: Secondary | ICD-10-CM | POA: Diagnosis present

## 2017-10-05 DIAGNOSIS — R079 Chest pain, unspecified: Secondary | ICD-10-CM

## 2017-10-05 DIAGNOSIS — E8881 Metabolic syndrome: Secondary | ICD-10-CM | POA: Diagnosis present

## 2017-10-05 DIAGNOSIS — I10 Essential (primary) hypertension: Secondary | ICD-10-CM | POA: Diagnosis present

## 2017-10-05 DIAGNOSIS — Z6841 Body Mass Index (BMI) 40.0 and over, adult: Secondary | ICD-10-CM | POA: Diagnosis not present

## 2017-10-05 DIAGNOSIS — I25110 Atherosclerotic heart disease of native coronary artery with unstable angina pectoris: Secondary | ICD-10-CM | POA: Diagnosis present

## 2017-10-05 DIAGNOSIS — R072 Precordial pain: Secondary | ICD-10-CM | POA: Diagnosis present

## 2017-10-05 DIAGNOSIS — Z7902 Long term (current) use of antithrombotics/antiplatelets: Secondary | ICD-10-CM | POA: Diagnosis not present

## 2017-10-05 DIAGNOSIS — Z955 Presence of coronary angioplasty implant and graft: Secondary | ICD-10-CM | POA: Diagnosis not present

## 2017-10-05 DIAGNOSIS — Z79899 Other long term (current) drug therapy: Secondary | ICD-10-CM | POA: Diagnosis not present

## 2017-10-05 DIAGNOSIS — Z91048 Other nonmedicinal substance allergy status: Secondary | ICD-10-CM | POA: Diagnosis not present

## 2017-10-05 DIAGNOSIS — E785 Hyperlipidemia, unspecified: Secondary | ICD-10-CM | POA: Diagnosis present

## 2017-10-05 DIAGNOSIS — I959 Hypotension, unspecified: Secondary | ICD-10-CM | POA: Diagnosis not present

## 2017-10-05 DIAGNOSIS — Z8241 Family history of sudden cardiac death: Secondary | ICD-10-CM | POA: Diagnosis not present

## 2017-10-05 DIAGNOSIS — I255 Ischemic cardiomyopathy: Secondary | ICD-10-CM | POA: Diagnosis present

## 2017-10-05 LAB — GLUCOSE, CAPILLARY
GLUCOSE-CAPILLARY: 95 mg/dL (ref 65–99)
Glucose-Capillary: 126 mg/dL — ABNORMAL HIGH (ref 65–99)

## 2017-10-05 LAB — HIV ANTIBODY (ROUTINE TESTING W REFLEX): HIV SCREEN 4TH GENERATION: NONREACTIVE

## 2017-10-05 LAB — D-DIMER, QUANTITATIVE: D-Dimer, Quant: 0.34 ug/mL-FEU (ref 0.00–0.50)

## 2017-10-05 LAB — CBG MONITORING, ED
GLUCOSE-CAPILLARY: 108 mg/dL — AB (ref 65–99)
GLUCOSE-CAPILLARY: 100 mg/dL — AB (ref 65–99)

## 2017-10-05 LAB — TROPONIN I
TROPONIN I: 0.05 ng/mL — AB (ref <0.03)
Troponin I: 0.05 ng/mL (ref <0.03)
Troponin I: 0.04 ng/mL (ref <0.03)

## 2017-10-05 LAB — HEPARIN LEVEL (UNFRACTIONATED)
Heparin Unfractionated: 0.28 IU/mL — ABNORMAL LOW (ref 0.30–0.70)
Heparin Unfractionated: 0.32 IU/mL (ref 0.30–0.70)

## 2017-10-05 MED ORDER — HEPARIN (PORCINE) IN NACL 100-0.45 UNIT/ML-% IJ SOLN
1650.0000 [IU]/h | INTRAMUSCULAR | Status: DC
  Administered 2017-10-05: 1450 [IU]/h via INTRAVENOUS
  Administered 2017-10-05 – 2017-10-06 (×2): 1650 [IU]/h via INTRAVENOUS
  Filled 2017-10-05 (×4): qty 250

## 2017-10-05 MED ORDER — HEPARIN BOLUS VIA INFUSION
4000.0000 [IU] | Freq: Once | INTRAVENOUS | Status: AC
  Administered 2017-10-05: 4000 [IU] via INTRAVENOUS
  Filled 2017-10-05: qty 4000

## 2017-10-05 NOTE — Progress Notes (Signed)
ANTICOAGULATION CONSULT NOTE - Initial Consult  Pharmacy Consult for heparin Indication: chest pain/ACS  Allergies  Allergen Reactions  . Strawberry Extract Itching    Patient Measurements: Height:  (172.7 cm) Weight: (!) 319 lb (144.7 kg) IBW/kg (Calculated) : 68.4 Heparin Dosing Weight: 103 kg  Vital Signs: Temp: 98.3 F (36.8 C) (05/18 1840) Temp Source: Oral (05/18 1840) BP: 101/68 (05/19 0202) Pulse Rate: 64 (05/19 0202)  Labs: Recent Labs    10/04/17 1434 10/04/17 2339  HGB 15.5  --   HCT 47.4  --   PLT 284  --   CREATININE 1.25*  --   TROPONINI  --  0.05*    Estimated Creatinine Clearance: 98.9 mL/min (A) (by C-G formula based on SCr of 1.25 mg/dL (H)).   Medical History: Past Medical History:  Diagnosis Date  . Cardiomyopathy, ischemic: Ejection fraction 35-40% grade 2 diastolic dysfunction 10/06/2012   Resolved  . Coronary artery disease: 09/28/2012: 100% occluded LAD in the midportion and treated with a Promus drug eluting stent 10/06/2012  . Dyslipidemia 09/27/2012  . H/O echocardiogram 01/21/2013   EF 55-60%, normal WM -- normal Echo!!  . Hyperglycemia 09/27/2012  . Kidney stone   . Metabolic syndrome - DM, HLD, Obesity 09/30/2012  . NSTEMI (non-ST elevated myocardial infarction) - possible Missed STEMI 09/27/2012  . Obesity, morbid (more than 100 lbs over ideal weight or BMI > 40) (HCC)     Medications:  See medication history  Assessment: 52 yo man to start heparin for CP.  He was not on anticoagulation PTA Goal of Therapy:  Heparin level 0.3-0.7 units/ml Monitor platelets by anticoagulation protocol: Yes   Plan:  Heparin bolus 4000 units and drip at 1450 units/hr Check heparin level 6 hours after start Daily heparin level and CBC while on heparin Monitor for bleeding complications  Thomas Beck 10/05/2017,2:31 AM

## 2017-10-05 NOTE — Progress Notes (Signed)
Progress Note  Patient Name: Thomas Beck Date of Encounter: 10/05/2017  Primary Cardiologist: [facility] last saw 2017 Primary Electrophysiologist: na   Patient Profile     52 y.o. male admitted with chest pain and positive troponins.   History of coronary artery disease with prior LAD stenting in 2014 EF 35-40>> 55-60 He had been managed with long-term Plavix alone; about a week ago he ran out. Chest pains are distinct from his MI discomfort.  They are left shoulder lasting minutes    Subjective   Ongoing intermittent discomfort in his left shoulder area which is unaffected by movement but aggravated by deep breathing  Inpatient Medications    Scheduled Meds: . atorvastatin  40 mg Oral q1800  . clopidogrel  75 mg Oral Daily  . insulin aspart  0-15 Units Subcutaneous TID WC  . lisinopril  2.5 mg Oral Daily  . metoprolol succinate  25 mg Oral Daily  . pantoprazole  40 mg Oral Daily  . tamsulosin  0.4 mg Oral QPC supper   Continuous Infusions: . heparin 1,650 Units/hr (10/05/17 1052)   PRN Meds: acetaminophen, zolpidem   Vital Signs    Vitals:   10/05/17 0700 10/05/17 0800 10/05/17 0845 10/05/17 1000  BP: 93/65 (!) 104/59  104/61  Pulse: (!) 54 66 87 64  Resp: Temp:      TempSrc:      SpO2: 97% 97% 99% 100%  Weight:      Height:        Intake/Output Summary (Last 24 hours) at 10/05/2017 1111 Last data filed at 10/05/2017 1610 Gross per 24 hour  Intake -  Output 1 ml  Net -1 ml   Filed Weights   10/04/17 1426  Weight: (!) 319 lb (144.7 kg)    Telemetry   sinus - Personally Reviewed  ECG    From 1421 yesterday and 704 this morning are unchanged with an isolated T wave inversion in lead III- Personally Reviewed  Physical Exam   GEN: No acute distress.   Morbidly obese Neck: JVD fla Cardiac: RRR, no  murmurs, rubs, or gallops.  Respiratory: Clear to auscultation bilaterally. GI: Soft, nontender, non-distended  MS:  edema; No  deformity. Neuro:  Nonfocal  Psych: Normal affect  Skin Warm and dry   Labs    Chemistry Recent Labs  Lab 10/04/17 1434  NA 138  K 4.4  CL 105  CO2 23  GLUCOSE 111*  BUN 12  CREATININE 1.25*  CALCIUM 9.0  GFRNONAA >60  GFRAA >60  ANIONGAP 10     Hematology Recent Labs  Lab 10/04/17 1434  WBC 8.4  RBC 5.41  HGB 15.5  HCT 47.4  MCV 87.6  MCH 28.7  MCHC 32.7  RDW 12.4  PLT 284    Cardiac Enzymes Recent Labs  Lab 10/04/17 2339 10/05/17 0257 10/05/17 0514  TROPONINI 0.05* 0.05* 0.04*    Recent Labs  Lab 10/04/17 1450 10/04/17 1933  TROPIPOC 0.05 0.07     BNPNo results for input(s): BNP, PROBNP in the last 168 hours.   DDimer No results for input(s): DDIMER in the last 168 hours.   Radiology    Dg Chest 2 View  Result Date: 10/04/2017 CLINICAL DATA:  Pt is having chest pains on left side says he had M.I 5 years ago and has a stent put in , pain started early today says he took two nitro. EXAM: CHEST - 2 VIEW COMPARISON:  None. FINDINGS:  Normal mediastinum and cardiac silhouette. Normal pulmonary vasculature. No evidence of effusion, infiltrate, or pneumothorax. No acute bony abnormality. Thin post processing line artifact bisects the radiograph top-to-bottom. IMPRESSION: No acute cardiopulmonary process. Electronically Signed   By: Genevive Bi M.D.   On: 10/04/2017 15:51        Assessment & Plan    Chest pain-atypical pleuritic component Coronary artery disease with prior LAD stenting Morbid obesity Diabetes Hypertension   The patient has atypical chest pain in the context of prior LAD stenting.  His pretest likelihood is artificially high and the lack of specificity and sensitivity of imaging sufficiently low and I have suggested that we pursue catheterization.  He is agreeable.  We will continue to hold metformin.  Need to consider home sleep study.  We will check a d-dimer given the pleuritic component although no obvious risk  factors.   Signed, Sherryl Manges, MD  10/05/2017, 11:11 AM

## 2017-10-05 NOTE — ED Notes (Signed)
Pt given bag lunch with PO fluids

## 2017-10-05 NOTE — ED Notes (Signed)
Dr Graciela Husbands returned page. Aware pt c/o chest tightness. Advised pt is on their rounds and will see pt later today. Pt and spouse aware.

## 2017-10-05 NOTE — Progress Notes (Signed)
ANTICOAGULATION CONSULT NOTE  Pharmacy Consult:  Heparin Indication: chest pain/ACS  Allergies  Allergen Reactions  . Strawberry Extract Itching    Patient Measurements: Height:  (172.7 cm) Weight: (!) 306 lb 3.2 oz (138.9 kg) IBW/kg (Calculated) : 68.4 Heparin Dosing Weight: 103 kg  Vital Signs: Temp: 97.8 F (36.6 C) (05/19 1709) Temp Source: Oral (05/19 1709) BP: 103/65 (05/19 1709) Pulse Rate: 63 (05/19 1709)  Labs: Recent Labs    10/04/17 1434 10/04/17 2339 10/05/17 0257 10/05/17 0514 10/05/17 0918 10/05/17 1316  HGB 15.5  --   --   --   --   --   HCT 47.4  --   --   --   --   --   PLT 284  --   --   --   --   --   HEPARINUNFRC  --   --   --   --  0.28* 0.32  CREATININE 1.25*  --   --   --   --   --   TROPONINI  --  0.05* 0.05* 0.04*  --   --     Estimated Creatinine Clearance: 96.6 mL/min (A) (by C-G formula based on SCr of 1.25 mg/dL (H)).    Assessment: 52 YOM continues on IV heparin for ACS.   PM heparin level = 0.32   Goal of Therapy:  Heparin level 0.3-0.7 units/ml Monitor platelets by anticoagulation protocol: Yes     Plan:  Continue heparin at 1650 units / hr Daily heparin level and CBC  Thank you Okey Regal, PharmD [phone] 10/05/2017, 5:37 PM

## 2017-10-05 NOTE — ED Notes (Signed)
Spouse at bedside w/pt.

## 2017-10-05 NOTE — ED Notes (Signed)
Pt states pain as subsided - 0/10.

## 2017-10-05 NOTE — ED Notes (Signed)
Pt c/o sharp left chest/shoulder pain. Secretary paging admitting MD. Pt now states pain is more in shoulder than chest. Monitor intact to pt - SR - HR 79. VSS. Denies c/o shortness of breath. States when deep breathes has pain in mid back.

## 2017-10-05 NOTE — ED Notes (Signed)
Secretary paging Dr Jens Som d/t pt c/o chest tightness occurring intermittently.

## 2017-10-05 NOTE — ED Provider Notes (Signed)
[facility] EMERGENCY DEPARTMENT Provider Note   CSN: [id number] Arrival date & time: 10/04/17  1420     History   Chief Complaint Chief Complaint  Patient presents with  . Chest Pain    HPI Thomas Beck is a 52 y.o. male.  Patient with 2 episodes of chest pain today.  Patient status post non-STEMI with stent placement in 2014.  This morning at around 845 had 1 to 2 hours worth of left anterior chest pain with radiation towards the shoulder.  Patient took nitroglycerin it went away.  Had a second episode of onset of chest pain around 1130 that lasted 10 to 15 minutes took a nitro earlier and pain went away.  Patient was not particularly exerting himself at any of these times.  No nausea vomiting no shortness of breath no diaphoresis.  Patient has not had any chest pain since his heart attack in 2014.  Patient is followed by Dr. Herbie Baltimore from cardiology.  Patient has not had any chest pain since around 12 noon today.  Patient did not take any aspirin today.     Past Medical History:  Diagnosis Date  . Cardiomyopathy, ischemic: Ejection fraction 35-40% grade 2 diastolic dysfunction 10/06/2012   Resolved  . Coronary artery disease: 09/28/2012: 100% occluded LAD in the midportion and treated with a Promus drug eluting stent 10/06/2012  . Dyslipidemia 09/27/2012  . H/O echocardiogram 01/21/2013   EF 55-60%, normal WM -- normal Echo!!  . Hyperglycemia 09/27/2012  . Kidney stone   . Metabolic syndrome - DM, HLD, Obesity 09/30/2012  . NSTEMI (non-ST elevated myocardial infarction) - possible Missed STEMI 09/27/2012  . Obesity, morbid (more than 100 lbs over ideal weight or BMI > 40) (HCC)     Patient Active Problem List   Diagnosis Date Noted  . Chest pain 10/04/2017  . Coronary artery disease: 09/28/2012: 100% occluded midLAD - PCI w/ Promus drug eluting stent 10/06/2012  . Metabolic syndrome - DM, HLD, Obesity 09/30/2012  . NSTEMI (non-ST elevated myocardial infarction)  - possible Missed STEMI 09/27/2012  . Obesity, morbid (more than 100 lbs over ideal weight or BMI > 40) (HCC) 09/27/2012  . Dyslipidemia, goal LDL below 70 09/27/2012  . Hyperglycemia 09/27/2012  . Kidney stone     Past Surgical History:  Procedure Laterality Date  . CARDIAC CATHETERIZATION  09/28/2012   Severe 1VCAD --100% mid LAD occlusion just after Mod-large D1 with a ~40% ostial/proximal stenosis;  . CORONARY ANGIOPLASTY WITH STENT PLACEMENT  09/28/2012   Mid LAD: Promus Premier DES 3.0 mm x 28 mm (post-dilated to 3.4 mm proximal, 3.3 mm mid & 3.15 mm distal)  . DOPPLER ECHOCARDIOGRAPHY  09/27/2012   EF 35-40%, moderately reduced. Severe hypokinesis of the mid to distal anteroseptal, anterior and apical myocardium consistent LAD infarction. Grade 2 diastolic dysfunction.  . DOPPLER ECHOCARDIOGRAPHY  01/2013   EF 55-60%, no regional WMA,   . LEFT HEART CATHETERIZATION WITH CORONARY ANGIOGRAM N/A 09/28/2012   Procedure: LEFT HEART CATHETERIZATION WITH CORONARY ANGIOGRAM;  Surgeon: Marykay Lex, MD;  Location: [facility] CATH LAB;  Service: Cardiovascular;  Laterality: N/A;  . PERCUTANEOUS CORONARY STENT INTERVENTION (PCI-S)  09/28/2012   Procedure: PERCUTANEOUS CORONARY STENT INTERVENTION (PCI-S);  Surgeon: Marykay Lex, MD;  Location: [facility] CATH LAB;  Service: Cardiovascular;;        Home Medications    Prior to Admission medications   Medication Sig Start Date End Date Taking? Authorizing Provider  atorvastatin (LIPITOR) 80 MG  tablet Take 1 tablet (80 mg total) by mouth daily at 6 PM. NEEDS APPOINTMENT FOR FUTURE REFILLS Patient taking differently: Take 40 mg by mouth daily at 6 PM. NEEDS APPOINTMENT FOR FUTURE REFILLS 09/26/14  Yes Marykay Lex, MD  clopidogrel (PLAVIX) 75 MG tablet TAKE 1 TABLET BY MOUTH ONCE DAILY NEEDS  APPOINTMENT  FOR  ADDITIONAL  REFILLS 10/30/16  Yes Marykay Lex, MD  lisinopril (PRINIVIL,ZESTRIL) 2.5 MG tablet Take 1 tablet (2.5 mg total) by mouth daily.  <PLEASE MAKE APPOINTMENT FOR REFILLS> 02/03/17  Yes Marykay Lex, MD  metFORMIN (GLUCOPHAGE) 500 MG tablet Take 1 tablet (500 mg total) by mouth 2 (two) times daily with a meal. 09/30/12  Yes Robbie Lis M, PA-C  metoprolol succinate (TOPROL-XL) 25 MG 24 hr tablet Take 25 mg by mouth daily. 08/13/17  Yes [provider]  nitroGLYCERIN (NITROSTAT) 0.4 MG SL tablet Place 1 tablet (0.4 mg total) under the tongue every 5 (five) minutes as needed for chest pain. MAX 3 DOSES 06/20/15  Yes Marykay Lex, MD  pantoprazole (PROTONIX) 40 MG tablet TAKE 1 TABLET DAILY AT 12:00 NOON 06/26/15  Yes Marykay Lex, MD  tamsulosin (FLOMAX) 0.4 MG CAPS Take 0.4 mg by mouth daily after supper.   Yes [provider]  metoprolol succinate (TOPROL-XL) 25 MG 24 hr tablet Take by mouth. 05/17/15 05/16/16  [provider]    Family History Family History  Problem Relation Age of Onset  . Sudden death Other     Social History Social History   Tobacco Use  . Smoking status: Never Smoker  . Smokeless tobacco: Never Used  Substance Use Topics  . Alcohol use: Yes    Comment: seldom  . Drug use: Not on file     Allergies   Strawberry extract   Review of Systems Review of Systems  Constitutional: Negative for diaphoresis and fever.  HENT: Negative for congestion.   Eyes: Negative for visual disturbance.  Respiratory: Negative for shortness of breath.   Cardiovascular: Positive for chest pain. Negative for palpitations and leg swelling.  Gastrointestinal: Negative for abdominal pain.  Genitourinary: Negative for dysuria.  Musculoskeletal: Negative for back pain.  Skin: Negative for rash.  Neurological: Negative for headaches.  Hematological: Does not bruise/bleed easily.  Psychiatric/Behavioral: Negative for confusion.     Physical Exam Updated Vital Signs BP 103/84   Pulse 76   Temp 98.3 F (36.8 C) (Oral)   Resp 20   Ht 1.727 m ( )   Wt (!) 144.7 kg  (319 lb)   SpO2 98%   BMI 48.50 kg/m   Physical Exam  Constitutional: He is oriented to person, place, and time. He appears well-developed and well-nourished. No distress.  HENT:  Head: Normocephalic and atraumatic.  Mouth/Throat: Oropharynx is clear and moist.  Eyes: Pupils are equal, round, and reactive to light. Conjunctivae and EOM are normal.  Neck: Neck supple.  Cardiovascular: Normal rate, regular rhythm and normal heart sounds.  Pulmonary/Chest: Breath sounds normal. No respiratory distress.  Abdominal: Soft. Bowel sounds are normal. There is no tenderness.  Musculoskeletal: Normal range of motion. He exhibits no edema.  Neurological: He is alert and oriented to person, place, and time. No cranial nerve deficit or sensory deficit. He exhibits normal muscle tone. Coordination normal.  Skin: Skin is warm.  Nursing note and vitals reviewed.    ED Treatments / Results  Labs (all labs ordered are listed, but only abnormal results are displayed)  Labs Reviewed  BASIC METABOLIC PANEL - Abnormal; Notable for the following components:      Result Value   Glucose, Bld 111 (*)    Creatinine, Ser 1.25 (*)    All other components within normal limits  CBC  HIV ANTIBODY (ROUTINE TESTING)  TROPONIN I  TROPONIN I  TROPONIN I  I-STAT TROPONIN, ED  I-STAT TROPONIN, ED    EKG None  Radiology Dg Chest 2 View  Result Date: 10/04/2017 CLINICAL DATA:  Pt is having chest pains on left side says he had M.I 5 years ago and has a stent put in , pain started early today says he took two nitro. EXAM: CHEST - 2 VIEW COMPARISON:  None. FINDINGS: Normal mediastinum and cardiac silhouette. Normal pulmonary vasculature. No evidence of effusion, infiltrate, or pneumothorax. No acute bony abnormality. Thin post processing line artifact bisects the radiograph top-to-bottom. IMPRESSION: No acute cardiopulmonary process. Electronically Signed   By: Genevive Bi M.D.   On: 10/04/2017 15:51     Procedures Procedures (including critical care time)  Medications Ordered in ED Medications  atorvastatin (LIPITOR) tablet 40 mg (has no administration in time range)  clopidogrel (PLAVIX) tablet 75 mg (has no administration in time range)  lisinopril (PRINIVIL,ZESTRIL) tablet 2.5 mg (has no administration in time range)  metoprolol succinate (TOPROL-XL) 24 hr tablet 25 mg (has no administration in time range)  pantoprazole (PROTONIX) EC tablet 40 mg (40 mg Oral Given 10/04/17 2337)  tamsulosin (FLOMAX) capsule 0.4 mg (has no administration in time range)  acetaminophen (TYLENOL) tablet 650 mg (has no administration in time range)  zolpidem (AMBIEN) tablet 2.5 mg (has no administration in time range)  insulin aspart (novoLOG) injection 0-15 Units (has no administration in time range)  aspirin chewable tablet 324 mg (324 mg Oral Given 10/04/17 2114)     Initial Impression / Assessment and Plan / ED Course  I have reviewed the triage vital signs and the nursing notes.  Pertinent labs & imaging results that were available during my care of the patient were reviewed by me and considered in my medical decision making (see chart for details).      discussed with cardiology.  Patient symptoms very concerning for an unstable angina picture.  Cardiology will admit for overnight monitoring.  Patient's troponin here negative chest x-ray without any acute findings.  EKG without any acute cardiac changes.  Patient's been asymptomatic since 12 noon.  Patient has not been on his daily aspirin.  Final Clinical Impressions(s) / ED Diagnoses   Final diagnoses:  Precordial pain    ED Discharge Orders    None       Vanetta Mulders, MD 10/05/17 [phone]

## 2017-10-05 NOTE — ED Notes (Signed)
Dr Graciela Husbands in w/pt and spouse.

## 2017-10-05 NOTE — Progress Notes (Signed)
ANTICOAGULATION CONSULT NOTE  Pharmacy Consult:  Heparin Indication: chest pain/ACS  Allergies  Allergen Reactions  . Strawberry Extract Itching    Patient Measurements: Height:  (172.7 cm) Weight: (!) 319 lb (144.7 kg) IBW/kg (Calculated) : 68.4 Heparin Dosing Weight: 103 kg  Vital Signs: BP: 104/59 (05/19 0800) Pulse Rate: 87 (05/19 0845)  Labs: Recent Labs    10/04/17 1434 10/04/17 2339 10/05/17 0257 10/05/17 0514 10/05/17 0918  HGB 15.5  --   --   --   --   HCT 47.4  --   --   --   --   PLT 284  --   --   --   --   HEPARINUNFRC  --   --   --   --  0.28*  CREATININE 1.25*  --   --   --   --   TROPONINI  --  0.05* 0.05* 0.04*  --     Estimated Creatinine Clearance: 98.9 mL/min (A) (by C-G formula based on SCr of 1.25 mg/dL (H)).    Assessment: 52 YOM continues on IV heparin for ACS.  Heparin level is slightly below goal; no bleeding reported.   Goal of Therapy:  Heparin level 0.3-0.7 units/ml Monitor platelets by anticoagulation protocol: Yes     Plan:  Increase heparin gtt to 1650 units/hr Check 6 hr heparin level Daily heparin level and CBC   Shareese Macha D. Laney Potash, PharmD, BCPS, BCCCP Pager:  [phone] 10/05/2017, 10:24 AM

## 2017-10-05 NOTE — ED Notes (Addendum)
Pt Lunch order placed. 

## 2017-10-06 ENCOUNTER — Other Ambulatory Visit (HOSPITAL_COMMUNITY): Payer: Self-pay

## 2017-10-06 ENCOUNTER — Encounter (HOSPITAL_COMMUNITY): Payer: Self-pay | Admitting: Cardiology

## 2017-10-06 ENCOUNTER — Inpatient Hospital Stay (HOSPITAL_COMMUNITY): Payer: 59

## 2017-10-06 ENCOUNTER — Inpatient Hospital Stay (HOSPITAL_COMMUNITY)
Admission: EM | Disposition: A | Discharge status: Home / Self Care | Payer: Self-pay | Source: Home / Self Care | Attending: Cardiology

## 2017-10-06 ENCOUNTER — Other Ambulatory Visit (HOSPITAL_COMMUNITY): Payer: 59

## 2017-10-06 DIAGNOSIS — I25110 Atherosclerotic heart disease of native coronary artery with unstable angina pectoris: Secondary | ICD-10-CM

## 2017-10-06 HISTORY — PX: CORONARY STENT INTERVENTION: CATH118234

## 2017-10-06 HISTORY — PX: LEFT HEART CATH AND CORONARY ANGIOGRAPHY: CATH118249

## 2017-10-06 LAB — CBC
HCT: 43.5 % (ref 39.0–52.0)
Hemoglobin: 14.2 g/dL (ref 13.0–17.0)
MCH: 28.6 pg (ref 26.0–34.0)
MCHC: 32.6 g/dL (ref 30.0–36.0)
MCV: 87.5 fL (ref 78.0–100.0)
PLATELETS: 250 10*3/uL (ref 150–400)
RBC: 4.97 MIL/uL (ref 4.22–5.81)
RDW: 12.6 % (ref 11.5–15.5)
WBC: 6.4 10*3/uL (ref 4.0–10.5)

## 2017-10-06 LAB — GLUCOSE, CAPILLARY
GLUCOSE-CAPILLARY: 142 mg/dL — AB (ref 65–99)
Glucose-Capillary: 103 mg/dL — ABNORMAL HIGH (ref 65–99)
Glucose-Capillary: 100 mg/dL — ABNORMAL HIGH (ref 65–99)
Glucose-Capillary: 81 mg/dL (ref 65–99)

## 2017-10-06 LAB — PROTIME-INR
INR: 1.05
PROTHROMBIN TIME: 13.6 s (ref 11.4–15.2)

## 2017-10-06 LAB — HEPARIN LEVEL (UNFRACTIONATED): HEPARIN UNFRACTIONATED: 1.58 [IU]/mL — AB (ref 0.30–0.70)

## 2017-10-06 LAB — POCT ACTIVATED CLOTTING TIME
ACTIVATED CLOTTING TIME: 252 s
Activated Clotting Time: 290 seconds

## 2017-10-06 SURGERY — LEFT HEART CATH AND CORONARY ANGIOGRAPHY
Anesthesia: LOCAL

## 2017-10-06 MED ORDER — LIDOCAINE HCL (PF) 1 % IJ SOLN
INTRAMUSCULAR | Status: DC | PRN
  Administered 2017-10-06: 2 mL via SUBCUTANEOUS

## 2017-10-06 MED ORDER — LIDOCAINE HCL (PF) 1 % IJ SOLN
INTRAMUSCULAR | Status: AC
  Filled 2017-10-06: qty 30

## 2017-10-06 MED ORDER — VERAPAMIL HCL 2.5 MG/ML IV SOLN
INTRAVENOUS | Status: AC
  Filled 2017-10-06: qty 2

## 2017-10-06 MED ORDER — SODIUM CHLORIDE 0.9 % IV SOLN
INTRAVENOUS | Status: AC
  Administered 2017-10-06: 17:00:00 via INTRAVENOUS

## 2017-10-06 MED ORDER — ONDANSETRON HCL 4 MG/2ML IJ SOLN
4.0000 mg | Freq: Four times a day (QID) | INTRAMUSCULAR | Status: DC | PRN
Start: 2017-10-06 — End: 2017-10-07

## 2017-10-06 MED ORDER — SODIUM CHLORIDE 0.9 % IV SOLN: INTRAVENOUS | Status: DC | PRN

## 2017-10-06 MED ORDER — CLOPIDOGREL BISULFATE 300 MG PO TABS
ORAL_TABLET | ORAL | Status: AC
  Filled 2017-10-06: qty 1

## 2017-10-06 MED ORDER — HEPARIN SODIUM (PORCINE) 1000 UNIT/ML IJ SOLN
INTRAMUSCULAR | Status: DC | PRN
  Administered 2017-10-06: 6000 [IU] via INTRAVENOUS
  Administered 2017-10-06: 4000 [IU] via INTRAVENOUS

## 2017-10-06 MED ORDER — SODIUM CHLORIDE 0.9% FLUSH: 3.0000 mL | Freq: Two times a day (BID) | INTRAVENOUS | Status: DC

## 2017-10-06 MED ORDER — SODIUM CHLORIDE 0.9% FLUSH
3.0000 mL | Freq: Two times a day (BID) | INTRAVENOUS | Status: DC
  Administered 2017-10-06: 3 mL via INTRAVENOUS

## 2017-10-06 MED ORDER — NITROGLYCERIN 1 MG/10 ML FOR IR/CATH LAB
INTRA_ARTERIAL | Status: AC
  Filled 2017-10-06: qty 10

## 2017-10-06 MED ORDER — HEPARIN (PORCINE) IN NACL 100-0.45 UNIT/ML-% IJ SOLN: 1450.0000 [IU]/h | INTRAMUSCULAR | Status: DC

## 2017-10-06 MED ORDER — IOPAMIDOL (ISOVUE-370) INJECTION 76%
INTRAVENOUS | Status: AC
  Filled 2017-10-06: qty 100

## 2017-10-06 MED ORDER — SODIUM CHLORIDE 0.9 % IV SOLN: 250.0000 mL | INTRAVENOUS | Status: DC | PRN

## 2017-10-06 MED ORDER — FENTANYL CITRATE (PF) 100 MCG/2ML IJ SOLN
INTRAMUSCULAR | Status: AC
  Filled 2017-10-06: qty 2

## 2017-10-06 MED ORDER — HYDRALAZINE HCL 20 MG/ML IJ SOLN: 5.0000 mg | INTRAMUSCULAR | Status: AC | PRN

## 2017-10-06 MED ORDER — ASPIRIN 81 MG PO CHEW
81.0000 mg | CHEWABLE_TABLET | ORAL | Status: AC
  Administered 2017-10-06: 81 mg via ORAL
  Filled 2017-10-06: qty 1

## 2017-10-06 MED ORDER — SODIUM CHLORIDE 0.9% FLUSH: 3.0000 mL | INTRAVENOUS | Status: DC | PRN

## 2017-10-06 MED ORDER — LABETALOL HCL 5 MG/ML IV SOLN: 10.0000 mg | INTRAVENOUS | Status: AC | PRN

## 2017-10-06 MED ORDER — HEPARIN SODIUM (PORCINE) 1000 UNIT/ML IJ SOLN
INTRAMUSCULAR | Status: AC
  Filled 2017-10-06: qty 1

## 2017-10-06 MED ORDER — VERAPAMIL HCL 2.5 MG/ML IV SOLN
INTRAVENOUS | Status: DC | PRN
  Administered 2017-10-06: 10 mL via INTRA_ARTERIAL

## 2017-10-06 MED ORDER — HEPARIN (PORCINE) IN NACL 1000-0.9 UT/500ML-% IV SOLN
INTRAVENOUS | Status: AC
  Filled 2017-10-06: qty 1000

## 2017-10-06 MED ORDER — SODIUM CHLORIDE 0.9 % WEIGHT BASED INFUSION
3.0000 mL/kg/h | INTRAVENOUS | Status: AC
  Administered 2017-10-06: 3 mL/kg/h via INTRAVENOUS

## 2017-10-06 MED ORDER — MIDAZOLAM HCL 2 MG/2ML IJ SOLN
INTRAMUSCULAR | Status: AC
  Filled 2017-10-06: qty 2

## 2017-10-06 MED ORDER — IOPAMIDOL (ISOVUE-370) INJECTION 76%
INTRAVENOUS | Status: DC | PRN
  Administered 2017-10-06: 135 mL via INTRA_ARTERIAL

## 2017-10-06 MED ORDER — FENTANYL CITRATE (PF) 100 MCG/2ML IJ SOLN
INTRAMUSCULAR | Status: DC | PRN
  Administered 2017-10-06: 25 ug via INTRAVENOUS

## 2017-10-06 MED ORDER — NITROGLYCERIN 1 MG/10 ML FOR IR/CATH LAB
INTRA_ARTERIAL | Status: DC | PRN
  Administered 2017-10-06: 200 ug via INTRACORONARY

## 2017-10-06 MED ORDER — CLOPIDOGREL BISULFATE 300 MG PO TABS
ORAL_TABLET | ORAL | Status: DC | PRN
  Administered 2017-10-06: 300 mg via ORAL

## 2017-10-06 MED ORDER — HEPARIN (PORCINE) IN NACL 2-0.9 UNITS/ML
INTRAMUSCULAR | Status: DC | PRN
  Administered 2017-10-06 (×2): 500 mL

## 2017-10-06 MED ORDER — MIDAZOLAM HCL 2 MG/2ML IJ SOLN
INTRAMUSCULAR | Status: DC | PRN
  Administered 2017-10-06: 2 mg via INTRAVENOUS

## 2017-10-06 MED ORDER — SODIUM CHLORIDE 0.9 % WEIGHT BASED INFUSION
1.0000 mL/kg/h | INTRAVENOUS | Status: DC
  Administered 2017-10-06: 1 mL/kg/h via INTRAVENOUS

## 2017-10-06 MED ORDER — ACETAMINOPHEN 325 MG PO TABS: 650.0000 mg | ORAL_TABLET | ORAL | Status: DC | PRN

## 2017-10-06 MED ORDER — ANGIOPLASTY BOOK
Freq: Once | Status: AC
  Administered 2017-10-06: 22:00:00
  Filled 2017-10-06: qty 1

## 2017-10-06 MED ORDER — HEART ATTACK BOUNCING BOOK
Freq: Once | Status: AC
  Administered 2017-10-06: 22:00:00
  Filled 2017-10-06: qty 1

## 2017-10-06 SURGICAL SUPPLY — 21 items
BALLN SAPPHIRE 3.0X12 (BALLOONS) ×2
BALLN ~~LOC~~ EMERGE MR 4.5X15 (BALLOONS) ×2
BALLOON SAPPHIRE 3.0X12 (BALLOONS) ×1 IMPLANT
BALLOON ~~LOC~~ EMERGE MR 4.5X15 (BALLOONS) ×1 IMPLANT
CATH INFINITI 5 FR JL3.5 (CATHETERS) ×2 IMPLANT
CATH OPTITORQUE TIG 4.0 5F (CATHETERS) ×2 IMPLANT
CATH VISTA GUIDE 6FR JR4 (CATHETERS) ×4 IMPLANT
COVER PRB 48X5XTLSCP FOLD TPE (BAG) ×1 IMPLANT
COVER PROBE 5X48 (BAG) ×1
DEVICE RAD COMP TR BAND LRG (VASCULAR PRODUCTS) ×2 IMPLANT
GUIDEWIRE INQWIRE 1.5J.035X260 (WIRE) ×1 IMPLANT
INQWIRE 1.5J .035X260CM (WIRE) ×2
KIT ENCORE 26 ADVANTAGE (KITS) ×2 IMPLANT
KIT HEART LEFT (KITS) ×2 IMPLANT
PACK CARDIAC CATHETERIZATION (CUSTOM PROCEDURE TRAY) ×2 IMPLANT
SHEATH RAIN RADIAL 21G 6FR (SHEATH) ×2 IMPLANT
STENT SYNERGY DES 4X24 (Permanent Stent) ×2 IMPLANT
STENT SYNERGY DES 4X8 (Permanent Stent) ×2 IMPLANT
TRANSDUCER W/STOPCOCK (MISCELLANEOUS) ×2 IMPLANT
TUBING CIL FLEX 10 FLL-RA (TUBING) ×2 IMPLANT
WIRE ASAHI PROWATER 180CM (WIRE) ×2 IMPLANT

## 2017-10-06 NOTE — Interval H&P Note (Signed)
History and Physical Interval Note:  10/06/2017 3:31 PM  Thomas Beck  has presented today for surgery, with the diagnosis of unstable angina.  The various methods of treatment have been discussed with the patient and family. After consideration of risks, benefits and other options for treatment, the patient has consented to  Procedure(s): LEFT HEART CATH AND CORONARY ANGIOGRAPHY (N/A) with possible PERCUTANEOUS CORONARY INTERVENTION  as a surgical intervention .  The patient's history has been reviewed, patient examined, no change in status, stable for surgery.  I have reviewed the patient's chart and labs.  Questions were answered to the patient's satisfaction.    Cath Lab Visit (complete for each Cath Lab visit)  Clinical Evaluation Leading to the Procedure:   ACS: Yes.    Non-ACS:    Anginal Classification: CCS III  Anti-ischemic medical therapy: Minimal Therapy (1 class of medications)  Non-Invasive Test Results: No non-invasive testing performed  Prior CABG: No previous CABG   Bryan Lemma

## 2017-10-06 NOTE — Progress Notes (Signed)
ANTICOAGULATION CONSULT NOTE  Pharmacy Consult:  Heparin Indication: chest pain/ACS  Allergies  Allergen Reactions  . Strawberry Extract Itching    Patient Measurements: Height:  (172.7 cm) Weight: (Abnormal) 307 lb 4.8 oz (139.4 kg) IBW/kg (Calculated) : 68.4 Heparin Dosing Weight: 103 kg  Vital Signs: Temp: 97.9 F (36.6 C) (05/20 0542) Temp Source: Oral (05/20 0542) BP: 113/79 (05/20 0917) Pulse Rate: 68 (05/20 0917)  Labs: Recent Labs    10/04/17 1434 10/04/17 2339 10/05/17 0257 10/05/17 0514 10/05/17 0918 10/05/17 1316 10/06/17 0601  HGB 15.5  --   --   --   --   --  14.2  HCT 47.4  --   --   --   --   --  43.5  PLT 284  --   --   --   --   --  250  LABPROT  --   --   --   --   --   --  13.6  INR  --   --   --   --   --   --  1.05  HEPARINUNFRC  --   --   --   --  0.28* 0.32 1.58*  CREATININE 1.25*  --   --   --   --   --   --   TROPONINI  --  0.05* 0.05* 0.04*  --   --   --    Estimated Creatinine Clearance: 96.8 mL/min (A) (by C-G formula based on SCr of 1.25 mg/dL (H)).  Assessment: 52 YOM continues on IV heparin for ACS. Planning cardiac cath later today. Heparin level above goal at this time. RN confirms the lab was collected correctly and no overt bleeding is reported. Will hold heparin for one hour then resume at lower rate.   Heparin level= 1.58  Goal of Therapy:  Heparin level 0.3-0.7 units/ml Monitor platelets by anticoagulation protocol: Yes   Plan:  Hold heparin gtt for 1 hour Resume at 1450 units/hr  Daily heparin level and CBC F/u anticoagulation plan post cath  Ruben Im, PharmD Clinical Pharmacist 10/06/2017 10:42 AM

## 2017-10-06 NOTE — Plan of Care (Signed)
  Problem: Clinical Measurements: Goal: Ability to maintain clinical measurements within normal limits will improve Outcome: Progressing Note:  Clinical measurements within normal limits.  Awaiting ECHO and heart cath. Goal: Will remain free from infection Outcome: Progressing Note:  No s/s of infection noted. Goal: Diagnostic test results will improve Outcome: Progressing Note:  Awaiting heart cath and ECHO.   Problem: Education: Goal: Knowledge of General Education information will improve Outcome: Completed/Met Note:  Oriented to room and unit and to unit routine.   Problem: Clinical Measurements: Goal: Respiratory complications will improve Outcome: Completed/Met Note:  No s/s of respiratory complications.

## 2017-10-06 NOTE — H&P (View-Only) (Signed)
Progress Note  Patient Name: Thomas Beck Date of Encounter: 10/06/2017  Primary Cardiologist: Bryan Lemma, MD   Subjective   Denies any CP or SOB. Last episode of CP was yesterday morning  Inpatient Medications    Scheduled Meds: . atorvastatin  40 mg Oral q1800  . clopidogrel  75 mg Oral Daily  . insulin aspart  0-15 Units Subcutaneous TID WC  . lisinopril  2.5 mg Oral Daily  . metoprolol succinate  25 mg Oral Daily  . pantoprazole  40 mg Oral Daily  . sodium chloride flush  3 mL Intravenous Q12H  . tamsulosin  0.4 mg Oral QPC supper   Continuous Infusions: . sodium chloride    . sodium chloride 1 mL/kg/hr (10/06/17 0740)  . heparin 1,650 Units/hr (10/05/17 1836)   PRN Meds: sodium chloride, acetaminophen, sodium chloride flush, zolpidem   Vital Signs    Vitals:   10/05/17 1459 10/05/17 1709 10/05/17 1937 10/06/17 0542  BP: 110/82 103/65 106/73 90/70  Pulse: 83 63 72 79  Resp: 18 18    Temp: 97.6 F (36.4 C) 97.8 F (36.6 C) 98.3 F (36.8 C) 97.9 F (36.6 C)  TempSrc: Oral Oral Oral Oral  SpO2: 99% 98% 99% 97%  Weight: (!) 306 lb 3.2 oz (138.9 kg)   (!) 307 lb 4.8 oz (139.4 kg)  Height:  (1.727 m)       Intake/Output Summary (Last 24 hours) at 10/06/2017 1610 Last data filed at 10/06/2017 0700 Gross per 24 hour  Intake 572.18 ml  Output -  Net 572.18 ml   Filed Weights   10/04/17 1426 10/05/17 1459 10/06/17 0542  Weight: (!) 319 lb (144.7 kg) (!) 306 lb 3.2 oz (138.9 kg) (!) 307 lb 4.8 oz (139.4 kg)    Telemetry    NSR without significant ventricular ectopy - Personally Reviewed  ECG    NSR with poor R wave progression and nonspecific T wave changes - Personally Reviewed  Physical Exam   GEN: No acute distress.   Neck: No JVD Cardiac: RRR, no murmurs, rubs, or gallops.  Respiratory: Clear to auscultation bilaterally. GI: Soft, nontender, non-distended  MS: No edema; No deformity. Neuro:  Nonfocal  Psych: Normal affect   Labs    Chemistry Recent Labs  Lab 10/04/17 1434  NA 138  K 4.4  CL 105  CO2 23  GLUCOSE 111*  BUN 12  CREATININE 1.25*  CALCIUM 9.0  GFRNONAA >60  GFRAA >60  ANIONGAP 10     Hematology Recent Labs  Lab 10/04/17 1434 10/06/17 0601  WBC 8.4 6.4  RBC 5.41 4.97  HGB 15.5 14.2  HCT 47.4 43.5  MCV 87.6 87.5  MCH 28.7 28.6  MCHC 32.7 32.6  RDW 12.4 12.6  PLT 284 250    Cardiac Enzymes Recent Labs  Lab 10/04/17 2339 10/05/17 0257 10/05/17 0514  TROPONINI 0.05* 0.05* 0.04*    Recent Labs  Lab 10/04/17 1450 10/04/17 1933  TROPIPOC 0.05 0.07     BNPNo results for input(s): BNP, PROBNP in the last 168 hours.   DDimer  Recent Labs  Lab 10/05/17 1316  DDIMER 0.34     Radiology    Dg Chest 2 View  Result Date: 10/04/2017 CLINICAL DATA:  Pt is having chest pains on left side says he had M.I 5 years ago and has a stent put in , pain started early today says he took two nitro. EXAM: CHEST - 2 VIEW COMPARISON:  None. FINDINGS:  Normal mediastinum and cardiac silhouette. Normal pulmonary vasculature. No evidence of effusion, infiltrate, or pneumothorax. No acute bony abnormality. Thin post processing line artifact bisects the radiograph top-to-bottom. IMPRESSION: No acute cardiopulmonary process. Electronically Signed   By: Stewart  Edmunds M.D.   On: 10/04/2017 15:51    Cardiac Studies   Cath 09/28/2012 POST-OPERATIVE DIAGNOSIS:    Severe Single Vessel CAD with 100% mid LAD occlusion just after Moderate to large caliber D1 that has ~40% ostial/proximal stenosis   Successful PCI of culprit lesion with single Promus Premier DES 3.0 mm x 28 mm (post-dilated to 3.4 mm proximal, 3.3 mm mid & 3.15 mm distal)  Minimal luminal irregularities in Cx-OM & RCA territories.   Moderate mid to apical anterior hypokinesis - EF ~35-40% by LV Gram  Patient Profile     52 y.o. male with PMH of CAD s/p LAD STEMI in 2014, prior h/o ICM with improved EF, DM II, HLD presented with chest  pain after running out of plavix for the past week  Assessment & Plan    1. CAD: on monotherapy plavix at home, ran out a week ago  - d-dimer negative, trop 0.05 --> 0.05 --> 0.04  - pending echo  - per Dr. Klein, given prior h/o and new chest pain, plan for cardiac catheterization today with Dr. Harding  - Risk and benefit of procedure explained to the patient who display clear understanding and agree to proceed.  Discussed with patient possible procedural risk include bleeding, vascular injury, renal injury, arrythmia, MI, stroke and loss of limb or life.  2. HLD: lipid panel tomorrow AM. Continue lipitor  3. DM II: SSI  4. Hypotension: BP borderline, asymptomatic, will continue on current toprol XL and low dose lisinopril  5. Obesity  For questions or updates, please contact [facility] Please consult [email or web address] for contact info under Cardiology/STEMI.      Signed, Hao Meng, PA  10/06/2017, 8:12 AM    

## 2017-10-06 NOTE — Progress Notes (Addendum)
Progress Note  Patient Name: Thomas Beck Date of Encounter: 10/06/2017  Primary Cardiologist: Bryan Lemma, MD   Subjective   Denies any CP or SOB. Last episode of CP was yesterday morning  Inpatient Medications    Scheduled Meds: . atorvastatin  40 mg Oral q1800  . clopidogrel  75 mg Oral Daily  . insulin aspart  0-15 Units Subcutaneous TID WC  . lisinopril  2.5 mg Oral Daily  . metoprolol succinate  25 mg Oral Daily  . pantoprazole  40 mg Oral Daily  . sodium chloride flush  3 mL Intravenous Q12H  . tamsulosin  0.4 mg Oral QPC supper   Continuous Infusions: . sodium chloride    . sodium chloride 1 mL/kg/hr (10/06/17 0740)  . heparin 1,650 Units/hr (10/05/17 1836)   PRN Meds: sodium chloride, acetaminophen, sodium chloride flush, zolpidem   Vital Signs    Vitals:   10/05/17 1459 10/05/17 1709 10/05/17 1937 10/06/17 0542  BP: 110/82 103/65 106/73 90/70  Pulse: 83 63 72 79  Resp: 18 18    Temp: 97.6 F (36.4 C) 97.8 F (36.6 C) 98.3 F (36.8 C) 97.9 F (36.6 C)  TempSrc: Oral Oral Oral Oral  SpO2: 99% 98% 99% 97%  Weight: (!) 306 lb 3.2 oz (138.9 kg)   (!) 307 lb 4.8 oz (139.4 kg)  Height:  (1.727 m)       Intake/Output Summary (Last 24 hours) at 10/06/2017 1610 Last data filed at 10/06/2017 0700 Gross per 24 hour  Intake 572.18 ml  Output -  Net 572.18 ml   Filed Weights   10/04/17 1426 10/05/17 1459 10/06/17 0542  Weight: (!) 319 lb (144.7 kg) (!) 306 lb 3.2 oz (138.9 kg) (!) 307 lb 4.8 oz (139.4 kg)    Telemetry    NSR without significant ventricular ectopy - Personally Reviewed  ECG    NSR with poor R wave progression and nonspecific T wave changes - Personally Reviewed  Physical Exam   GEN: No acute distress.   Neck: No JVD Cardiac: RRR, no murmurs, rubs, or gallops.  Respiratory: Clear to auscultation bilaterally. GI: Soft, nontender, non-distended  MS: No edema; No deformity. Neuro:  Nonfocal  Psych: Normal affect   Labs    Chemistry Recent Labs  Lab 10/04/17 1434  NA 138  K 4.4  CL 105  CO2 23  GLUCOSE 111*  BUN 12  CREATININE 1.25*  CALCIUM 9.0  GFRNONAA >60  GFRAA >60  ANIONGAP 10     Hematology Recent Labs  Lab 10/04/17 1434 10/06/17 0601  WBC 8.4 6.4  RBC 5.41 4.97  HGB 15.5 14.2  HCT 47.4 43.5  MCV 87.6 87.5  MCH 28.7 28.6  MCHC 32.7 32.6  RDW 12.4 12.6  PLT 284 250    Cardiac Enzymes Recent Labs  Lab 10/04/17 2339 10/05/17 0257 10/05/17 0514  TROPONINI 0.05* 0.05* 0.04*    Recent Labs  Lab 10/04/17 1450 10/04/17 1933  TROPIPOC 0.05 0.07     BNPNo results for input(s): BNP, PROBNP in the last 168 hours.   DDimer  Recent Labs  Lab 10/05/17 1316  DDIMER 0.34     Radiology    Dg Chest 2 View  Result Date: 10/04/2017 CLINICAL DATA:  Pt is having chest pains on left side says he had M.I 5 years ago and has a stent put in , pain started early today says he took two nitro. EXAM: CHEST - 2 VIEW COMPARISON:  None. FINDINGS:  Normal mediastinum and cardiac silhouette. Normal pulmonary vasculature. No evidence of effusion, infiltrate, or pneumothorax. No acute bony abnormality. Thin post processing line artifact bisects the radiograph top-to-bottom. IMPRESSION: No acute cardiopulmonary process. Electronically Signed   By: Genevive Bi M.D.   On: 10/04/2017 15:51    Cardiac Studies   Cath 09/28/2012 POST-OPERATIVE DIAGNOSIS:    Severe Single Vessel CAD with 100% mid LAD occlusion just after Moderate to large caliber D1 that has ~40% ostial/proximal stenosis   Successful PCI of culprit lesion with single Promus Premier DES 3.0 mm x 28 mm (post-dilated to 3.4 mm proximal, 3.3 mm mid & 3.15 mm distal)  Minimal luminal irregularities in Cx-OM & RCA territories.   Moderate mid to apical anterior hypokinesis - EF ~35-40% by LV Gram  Patient Profile     52 y.o. male with PMH of CAD s/p LAD STEMI in 2014, prior h/o ICM with improved EF, DM II, HLD presented with chest  pain after running out of plavix for the past week  Assessment & Plan    1. CAD: on monotherapy plavix at home, ran out a week ago  - d-dimer negative, trop 0.05 --> 0.05 --> 0.04  - pending echo  - per Dr. Graciela Husbands, given prior h/o and new chest pain, plan for cardiac catheterization today with Dr. Herbie Baltimore  - Risk and benefit of procedure explained to the patient who display clear understanding and agree to proceed.  Discussed with patient possible procedural risk include bleeding, vascular injury, renal injury, arrythmia, MI, stroke and loss of limb or life.  2. HLD: lipid panel tomorrow AM. Continue lipitor  3. DM II: SSI  4. Hypotension: BP borderline, asymptomatic, will continue on current toprol XL and low dose lisinopril  5. Obesity  For questions or updates, please contact [facility] Please consult [email or web address] for contact info under Cardiology/STEMI.      Ramond Dial, PA  10/06/2017, 8:12 AM

## 2017-10-07 ENCOUNTER — Inpatient Hospital Stay (HOSPITAL_COMMUNITY): Payer: 59

## 2017-10-07 ENCOUNTER — Encounter (HOSPITAL_COMMUNITY): Payer: Self-pay | Admitting: Cardiology

## 2017-10-07 DIAGNOSIS — I5030 Unspecified diastolic (congestive) heart failure: Secondary | ICD-10-CM

## 2017-10-07 DIAGNOSIS — I25110 Atherosclerotic heart disease of native coronary artery with unstable angina pectoris: Principal | ICD-10-CM

## 2017-10-07 LAB — GLUCOSE, CAPILLARY: GLUCOSE-CAPILLARY: 87 mg/dL (ref 65–99)

## 2017-10-07 LAB — BASIC METABOLIC PANEL
ANION GAP: 7 (ref 5–15)
BUN: 10 mg/dL (ref 6–20)
CHLORIDE: 105 mmol/L (ref 101–111)
CO2: 24 mmol/L (ref 22–32)
Calcium: 8.4 mg/dL — ABNORMAL LOW (ref 8.9–10.3)
Creatinine, Ser: 1.23 mg/dL (ref 0.61–1.24)
GFR calc Af Amer: >60 mL/min (ref >60)
GFR calc non Af Amer: >60 mL/min (ref >60)
GLUCOSE: 90 mg/dL (ref 65–99)
POTASSIUM: 4.1 mmol/L (ref 3.5–5.1)
Sodium: 136 mmol/L (ref 135–145)

## 2017-10-07 LAB — CBC
HCT: 42.1 % (ref 39.0–52.0)
HEMOGLOBIN: 13.7 g/dL (ref 13.0–17.0)
MCH: 28.3 pg (ref 26.0–34.0)
MCHC: 32.5 g/dL (ref 30.0–36.0)
MCV: 87 fL (ref 78.0–100.0)
PLATELETS: 241 10*3/uL (ref 150–400)
RBC: 4.84 MIL/uL (ref 4.22–5.81)
RDW: 12.5 % (ref 11.5–15.5)
WBC: 7.4 10*3/uL (ref 4.0–10.5)

## 2017-10-07 LAB — LIPID PANEL
CHOL/HDL RATIO: 5.8 ratio
Cholesterol: 185 mg/dL (ref 0–200)
HDL: 32 mg/dL — AB (ref >40)
LDL Cholesterol: 131 mg/dL — ABNORMAL HIGH (ref 0–99)
Triglycerides: 112 mg/dL (ref <150)
VLDL: 22 mg/dL (ref 0–40)

## 2017-10-07 LAB — ECHOCARDIOGRAM COMPLETE
HEIGHTINCHES: 68 in
WEIGHTICAEL: 4984.16 [oz_av]

## 2017-10-07 MED ORDER — ASPIRIN EC 81 MG PO TBEC: 81.0000 mg | DELAYED_RELEASE_TABLET | Freq: Every day | ORAL | 3 refills | Status: AC

## 2017-10-07 MED ORDER — PERFLUTREN LIPID MICROSPHERE
INTRAVENOUS | Status: AC
  Administered 2017-10-07: 10:00:00
  Filled 2017-10-07: qty 10

## 2017-10-07 MED ORDER — CLOPIDOGREL BISULFATE 75 MG PO TABS: 75.0000 mg | ORAL_TABLET | Freq: Every day | ORAL | 3 refills | Status: AC

## 2017-10-07 MED ORDER — PERFLUTREN LIPID MICROSPHERE
1.0000 mL | INTRAVENOUS | Status: AC | PRN
  Administered 2017-10-07: 09:00:00 2 mL via INTRAVENOUS
  Filled 2017-10-07: qty 10

## 2017-10-07 MED FILL — Heparin Sod (Porcine)-NaCl IV Soln 1000 Unit/500ML-0.9%: INTRAVENOUS | Qty: 1000 | Status: AC

## 2017-10-07 NOTE — Discharge Instructions (Signed)
PLEASE REMEMBER TO BRING ALL OF YOUR MEDICATIONS TO EACH OF YOUR FOLLOW-UP OFFICE VISITS.  PLEASE ATTEND ALL SCHEDULED FOLLOW-UP APPOINTMENTS.   Activity: Increase activity slowly as tolerated. You may shower, but no soaking baths (or swimming) for 1 week. No driving for 24 hours. No lifting over 5 lbs for 1 week. No sexual activity for 1 week.   You May Return to Work: in 1 week (if applicable)  Wound Care: You may wash cath site gently with soap and water. Keep cath site clean and dry. If you notice pain, swelling, bleeding or pus at your cath site, please call [phone].   Do not take your meformin 5/21 or 5/22. You can restart this medication of 5/23.

## 2017-10-07 NOTE — Discharge Summary (Signed)
Discharge Summary    Patient ID: Thomas Beck,  MRN: [id number], DOB/AGE: 06-04-1965 52 y.o.  Admit date: 10/04/2017 Discharge date: 10/07/2017  Primary Care Provider: Marisue Ivan Primary Cardiologist: Bryan Lemma, MD  Discharge Diagnoses    Active Problems:   Chest pain   Coronary artery disease involving native coronary artery of native heart with unstable angina pectoris (HCC)   Allergies Allergies  Allergen Reactions  . Strawberry Extract Itching    Diagnostic Studies/Procedures    Left Heart Catheterization: 10/06/17 Conclusion     CULPRIT LESION: Mid RCA-1 lesion is 99% stenosed. Mid RCA-2 lesion is 60% stenosed. - 2 Overlapping DES  A drug-eluting stent was successfully placed using a STENT SYNERGY DES 4X24 - covers both lesions; a Second overlapping drug-eluting stent was successfully placed using a STENT SYNERGY DES 4X8 to cover proximal edge teat --> post-dilated to 4.55 mm  Post intervention, there is a 0% residual stenosis.  _______________________________________________________________________  Franchot Mimes lesion is 60% stenosed.  Previously placed Prox LAD to Mid LAD stent (unknown type) is widely patent. Ost 2nd Diag lesion is 40% stenosed.  The left ventricular systolic function is normal. The left ventricular ejection fraction is 55-65% by visual estimate.  LV end diastolic pressure is mildly elevated.   Severe single-vessel disease involving the mid RCA with a 99% ulcerated lesion treated successfully with 2 overlapping DES stents (Synergy 4.0 mm x 24 and 8 mm postdilated to 4.5 mm) widely patent mid LAD stent with minimal disease in D1 and D2 Normal LV function with mildly elevated EDP.    _____________   History of Present Illness     Thomas Beck is a 52 year old gentleman with history of CAD status post LAD STEMI in 2014 with resulting ischemic cardiomyopathy which later improved, diabetes, obesity, hyperlipidemia presenting with acute  onset of several episodes of chest pain 10/04/17.  Last week, he notes that he had been off all antiplatelets for about 5 or 6 days due to confusion about what to take.  on 10/04/17, he awoke and while walking to get his dog experienced escalating chest pressure for several hours.  This is very rare for him and the first time he had any symptoms after his STEMI.  He eventually took a sublingual nitroglycerin which eased off his symptoms.  Later at a softball game while he was sitting he had recurrent symptoms at rest which was also nitro responsive.  This led him to come to the emergency room.  No recurrent symptoms since then and troponins have been negative.  Globally, he denies any other concerning signs or symptoms including dyspnea, bleeding troubles, edema.  He is concerned given that chest pain is very rare for him.    Hospital Course     Consultants: None   1. Chest pain in patient with CAD s/p STEMI (PCI to LAD 2014): patient presented with chest pressure which lasted for hours at a time and was improved with nitroglycerin. Also with a pleuritic component. Trop 0.05>0.05>0.04. Ddimer negative. EKG with NSR with non=specific T wave changes; overall without ischemic changes. He was started on a heparin gtt and underwent cardiac catheterization 10/06/17 which revealed severe single-vessel disease involving the mid RCA with 99% ulcerated lesion, successfully treated with 2 overlapping DES. Recommended to continue DAPT for at least 1 year. Echo read pending at the time of discharge - Restarted aspirin and plavix - Continued statin  2. HTN: intermittently hypotensive this admission, however remained asymptomatic. - Continue home  lisinopril and metoprolol  3. DM type 2: metformin held this admission; maintained on an ISS in-house. - Continued home metformin - patient instructed to restart 10/09/17  4. HLD: LDL 131.  - Continued home atorvastatin  daily. - Encouraged dietary changes   5.  Morbid obesity: BMI 47.  - Continue to encourage healthy eating and exercise habits  _____________  Discharge Vitals Blood pressure 103/66, pulse (!) 58, temperature 98.2 F (36.8 C), temperature source Oral, resp. rate 19, height  (1.727 m), weight (!) 311 lb 8.2 oz (141.3 kg), SpO2 99 %.  Filed Weights   10/05/17 1459 10/06/17 0542 10/07/17 0609  Weight: (!) 306 lb 3.2 oz (138.9 kg) (!) 307 lb 4.8 oz (139.4 kg) (!) 311 lb 8.2 oz (141.3 kg)    Labs & Radiologic Studies    CBC Recent Labs    10/06/17 0601 10/07/17 0256  WBC 6.4 7.4  HGB 14.2 13.7  HCT 43.5 42.1  MCV 87.5 87.0  PLT 250 241   Basic Metabolic Panel Recent Labs    40/98/11 1434 10/07/17 0256  NA 138 136  K 4.4 4.1  CL 105 105  CO2 23 24  GLUCOSE 111* 90  BUN 12 10  CREATININE 1.25* 1.23  CALCIUM 9.0 8.4*   Liver Function Tests No results for input(s): AST, ALT, ALKPHOS, BILITOT, PROT, ALBUMIN in the last 72 hours. No results for input(s): LIPASE, AMYLASE in the last 72 hours. Cardiac Enzymes Recent Labs    10/04/17 2339 10/05/17 0257 10/05/17 0514  TROPONINI 0.05* 0.05* 0.04*   BNP Invalid input(s): POCBNP D-Dimer Recent Labs    10/05/17 1316  DDIMER 0.34   Hemoglobin A1C No results for input(s): HGBA1C in the last 72 hours. Fasting Lipid Panel Recent Labs    10/07/17 0256  CHOL 185  HDL 32*  LDLCALC 131*  TRIG 112  CHOLHDL 5.8   Thyroid Function Tests No results for input(s): TSH, T4TOTAL, T3FREE, THYROIDAB in the last 72 hours.  Invalid input(s): FREET3 _____________  Dg Chest 2 View  Result Date: 10/04/2017 CLINICAL DATA:  Pt is having chest pains on left side says he had M.I 5 years ago and has a stent put in , pain started early today says he took two nitro. EXAM: CHEST - 2 VIEW COMPARISON:  None. FINDINGS: Normal mediastinum and cardiac silhouette. Normal pulmonary vasculature. No evidence of effusion, infiltrate, or pneumothorax. No acute bony abnormality. Thin  post processing line artifact bisects the radiograph top-to-bottom. IMPRESSION: No acute cardiopulmonary process. Electronically Signed   By: Genevive Bi M.D.   On: 10/04/2017 15:51   Disposition   Patient was seen and examined by Dr. Antoine Poche who deemed patient as stable for discharge. Follow-up has been arranged. Discharge medications as listed below.   Follow-up Plans & Appointments    Follow-up Information    Marykay Lex, MD Follow up on 10/23/2017.   Specialty:  Cardiology Why:  Please arrive 15 minutes early for your 3:00pm appointment Contact information: [address] [place] [address] [phone]          Discharge Instructions    AMB Referral to Cardiac Rehabilitation - Phase II   Complete by:  As directed    Diagnosis:   NSTEMI Coronary Stents     Diet - low sodium heart healthy   Complete by:  As directed    Increase activity slowly   Complete by:  As directed       Discharge  Medications   Allergies as of 10/07/2017      Reactions   Strawberry Extract Itching      Medication List    TAKE these medications   atorvastatin 80 MG tablet Commonly known as:  LIPITOR Take 1 tablet (80 mg total) by mouth daily at 6 PM. NEEDS APPOINTMENT FOR FUTURE REFILLS What changed:    how much to take  additional instructions   clopidogrel 75 MG tablet Commonly known as:  PLAVIX Take 1 tablet (75 mg total) by mouth daily. What changed:  See the new instructions.   lisinopril 2.5 MG tablet Commonly known as:  PRINIVIL,ZESTRIL Take 1 tablet (2.5 mg total) by mouth daily. <PLEASE MAKE APPOINTMENT FOR REFILLS>   metFORMIN 500 MG tablet Commonly known as:  GLUCOPHAGE Take 1 tablet (500 mg total) by mouth 2 (two) times daily with a meal. Notes to patient:  You can restart this medication on 10/09/17.   metoprolol succinate 25 MG 24 hr tablet Commonly known as:  TOPROL-XL Take by mouth. What changed:  Another medication with the same  name was removed. Continue taking this medication, and follow the directions you see here.   nitroGLYCERIN 0.4 MG SL tablet Commonly known as:  NITROSTAT Place 1 tablet (0.4 mg total) under the tongue every 5 (five) minutes as needed for chest pain. MAX 3 DOSES   pantoprazole 40 MG tablet Commonly known as:  PROTONIX TAKE 1 TABLET DAILY AT 12:00 NOON   tamsulosin 0.4 MG Caps capsule Commonly known as:  FLOMAX Take 0.4 mg by mouth daily after supper.        Aspirin prescribed at discharge?  Yes High Intensity Statin Prescribed? (Lipitor 40-80mg  or Crestor 20-40mg ): Yes Beta Blocker Prescribed? Yes For EF <40%, was ACEI/ARB Prescribed? Yes ADP Receptor Inhibitor Prescribed? (i.e. Plavix etc.-Includes Medically Managed Patients): Yes For EF <40%, Aldosterone Inhibitor Prescribed? No: EF>40 Was EF assessed during THIS hospitalization? Yes Was Cardiac Rehab II ordered? (Included Medically managed Patients): Yes   Outstanding Labs/Studies   Echo pending at the time of discharge.   Duration of Discharge Encounter   Greater than 30 minutes including physician time.  Signed, Beatriz Stallion PA-C 10/07/2017, 9:23 AM

## 2017-10-07 NOTE — Progress Notes (Signed)
*  PRELIMINARY RESULTS* Echocardiogram 2D Echocardiogram WITH DEFINITY has been performed.  Jeryl Columbia 10/07/2017, 9:25 AM

## 2017-10-07 NOTE — Progress Notes (Addendum)
Progress Note  Patient Name: Thomas Beck Date of Encounter: 10/07/2017  Primary Cardiologist: Bryan Lemma, MD   Subjective   Patient reports feeling back to baseline. No complaints of CP, SOB, or palpitations overnight. Has been up and walking around without issues.   Inpatient Medications    Scheduled Meds: . atorvastatin  40 mg Oral q1800  . clopidogrel  75 mg Oral Daily  . insulin aspart  0-15 Units Subcutaneous TID WC  . lisinopril  2.5 mg Oral Daily  . metoprolol succinate  25 mg Oral Daily  . pantoprazole  40 mg Oral Daily  . sodium chloride flush  3 mL Intravenous Q12H  . tamsulosin  0.4 mg Oral QPC supper   Continuous Infusions: . sodium chloride 75 mL/hr at 10/06/17 2100   PRN Meds: sodium chloride, acetaminophen, ondansetron (ZOFRAN) IV, sodium chloride flush, zolpidem   Vital Signs    Vitals:   10/06/17 2100 10/06/17 2200 10/07/17 0609 10/07/17 0723  BP: (!) 92/42 (!) 94/57 104/60 103/66  Pulse:   62 (!) 58  Resp: (!) Temp:   97.8 F (36.6 C) 98.2 F (36.8 C)  TempSrc:   Oral   SpO2: 96% 98% 97% 99%  Weight:   (!) 311 lb 8.2 oz (141.3 kg)   Height:        Intake/Output Summary (Last 24 hours) at 10/07/2017 0803 Last data filed at 10/07/2017 1610 Gross per 24 hour  Intake 1309.16 ml  Output 1850 ml  Net -540.84 ml   Filed Weights   10/05/17 1459 10/06/17 0542 10/07/17 0609  Weight: (!) 306 lb 3.2 oz (138.9 kg) (!) 307 lb 4.8 oz (139.4 kg) (!) 311 lb 8.2 oz (141.3 kg)    Telemetry    NSR and sinus bradycardia - Personally Reviewed  ECG    Sinus bradycardia with TWI in III and aVF; no STE/D - Personally Reviewed  Physical Exam   GEN: Laying in bed in no acute distress.   Neck: No JVD, no carotid bruits Cardiac: RRR, no murmurs, rubs, or gallops. R radial cath site, C/D/I. No significant ecchymosis or hematoma Respiratory: Clear to auscultation bilaterally, no wheezes/ rales/ rhonchi GI: NABS, obese, soft, nontender,  non-distended  MS: No edema; No deformity. Neuro:  Nonfocal, moving all extremities spontaneously Psych: Normal affect   Labs    Chemistry Recent Labs  Lab 10/04/17 1434 10/07/17 0256  NA 138 136  K 4.4 4.1  CL 105 105  CO2 23 24  GLUCOSE 111* 90  BUN 12 10  CREATININE 1.25* 1.23  CALCIUM 9.0 8.4*  GFRNONAA >60 >60  GFRAA >60 >60  ANIONGAP 10 7     Hematology Recent Labs  Lab 10/04/17 1434 10/06/17 0601 10/07/17 0256  WBC 8.4 6.4 7.4  RBC 5.41 4.97 4.84  HGB 15.5 14.2 13.7  HCT 47.4 43.5 42.1  MCV 87.6 87.5 87.0  MCH 28.7 28.6 28.3  MCHC 32.7 32.6 32.5  RDW 12.4 12.6 12.5  PLT 284 250 241    Cardiac Enzymes Recent Labs  Lab 10/04/17 2339 10/05/17 0257 10/05/17 0514  TROPONINI 0.05* 0.05* 0.04*    Recent Labs  Lab 10/04/17 1450 10/04/17 1933  TROPIPOC 0.05 0.07     BNPNo results for input(s): BNP, PROBNP in the last 168 hours.   DDimer  Recent Labs  Lab 10/05/17 1316  DDIMER 0.34     Radiology    No results found.  Cardiac Studies   Left  Heart Catheterization: 10/06/17 Conclusion     CULPRIT LESION: Mid RCA-1 lesion is 99% stenosed. Mid RCA-2 lesion is 60% stenosed. - 2 Overlapping DES  A drug-eluting stent was successfully placed using a STENT SYNERGY DES 4X24 - covers both lesions; a Second overlapping drug-eluting stent was successfully placed using a STENT SYNERGY DES 4X8 to cover proximal edge teat --> post-dilated to 4.55 mm  Post intervention, there is a 0% residual stenosis.  _______________________________________________________________________  Franchot Mimes lesion is 60% stenosed.  Previously placed Prox LAD to Mid LAD stent (unknown type) is widely patent. Ost 2nd Diag lesion is 40% stenosed.  The left ventricular systolic function is normal. The left ventricular ejection fraction is 55-65% by visual estimate.  LV end diastolic pressure is mildly elevated.  Severe single-vessel disease involving the mid RCA with a  99% ulcerated lesion treated successfully with 2 overlapping DES stents (Synergy 4.0 mm x 24 and 8 mm postdilated to 4.5 mm) widely patent mid LAD stent with minimal disease in D1 and D2 Normal LV function with mildly elevated EDP.      Patient Profile     52 y.o. male with PMH of CAD s/p STEMI (PCI to LAD 2014), ischemic cardiomyopathy following STEMI (resolved on subsequent echos), HTN,  HLD, DM type 2, morbid obesity, who presented with chest pain.   Assessment & Plan    1. Chest pain in patient with CAD s/p STEMI (PCI to LAD 2014): patient presented with chest pressure which lasted for hours at a time and was improved with nitroglycerin. Also with a pleuritic component. Trop 0.05>0.05>0.04. Ddimer negative. EKG with NSR with non=specific T wave changes; overall without ischemic changes. He was started on a heparin gtt and underwent cardiac catheterization 10/06/17 which revealed severe single-vessel disease involving the mid RCA with 99% ulcerated lesion, successfully treated with 2 overlapping DES. Recommended to continue DAPT for at least 1 year.  - Echo pending - Continue aspirin, plavix, and statin  2. HTN: intermittently hypotensive this admission, however remains asymptomatic. - Continue home lisinopril and metoprolol  3. DM type 2: metformin held this admission; maintained on an ISS in-house. - Plan to restart metformin 10/09/17  4. HLD: LDL 131.  - Continue home statin.  - Could consider referral to lipid clinic for consideration for PSK-9 inhibitor  5. Morbid obesity: BMI 47.  - Continue to encourage healthy eating and exercise habits    For questions or updates, please contact [facility] Please consult [email or web address] for contact info under Cardiology/STEMI.      Signed, Beatriz Stallion, PA-C  10/07/2017, 8:03 AM   [phone]  History and all data above reviewed.  Patient examined.  I agree with the findings as above. No chest pain.  No SOB.   The patient  exam reveals COR:RRR  ,  Lungs: Clear  ,  Abd: Positive bowel sounds, no rebound no guarding, Ext No edema  .  All available labs, radiology testing, previous records reviewed. Agree with documented assessment and plan. CAD:  PCI.  OK to go home.  I had a long discussion about diet and exercise.    Fayrene Fearing Ulisses Vondrak  8:40 AM  10/07/2017

## 2017-10-07 NOTE — Progress Notes (Signed)
CARDIAC REHAB PHASE I   PRE:  Rate/Rhythm: 70 SR  BP:  Supine: 112/68  Sitting:   Standing:    SaO2: 98%RA  MODE:  Ambulation: 800 ft   POST:  Rate/Rhythm: 87 SR  BP:  Supine:   Sitting: 127/79  Standing:    SaO2: 99%RA 0900-1000 Pt walked 800 ft on RA with steady gait and tolerated well. No CP. Education completed with pt and wife who voiced understanding. Stressed importance of plavix with stent. Reviewed NTG use, risk factors, ex ed, counting carbs and heart healthy food choices, and CRP2. Will refer to [facility] CRP 2. Pt stated he followed diet and ex closely right after MI but has slipped. Ready to get back to watching diet and ex.   Luetta Nutting, RN BSN  10/07/2017 9:53 AM

## 2017-10-07 NOTE — Progress Notes (Signed)
[facility] BAND REMOVAL  LOCATION:    right radial  DEFLATED PER PROTOCOL:    Yes.    TIME BAND OFF / DRESSING APPLIED:    2100   SITE UPON ARRIVAL:    Level 0  SITE AFTER BAND REMOVAL:    Level 0  CIRCULATION SENSATION AND MOVEMENT:    Within Normal Limits   Yes.    COMMENTS:   Pt.tolerated the procedure well.;Post TRB removal instructions given to pt.

## 2017-10-23 ENCOUNTER — Encounter: Payer: Self-pay | Admitting: Cardiology

## 2017-10-23 ENCOUNTER — Ambulatory Visit (INDEPENDENT_AMBULATORY_CARE_PROVIDER_SITE_OTHER): Payer: 59 | Admitting: Cardiology

## 2017-10-23 VITALS — BP 112/66 | HR 81 | Ht 68.0 in | Wt 308.0 lb

## 2017-10-23 DIAGNOSIS — R739 Hyperglycemia, unspecified: Secondary | ICD-10-CM

## 2017-10-23 DIAGNOSIS — E8881 Metabolic syndrome: Secondary | ICD-10-CM

## 2017-10-23 DIAGNOSIS — I214 Non-ST elevation (NSTEMI) myocardial infarction: Secondary | ICD-10-CM

## 2017-10-23 DIAGNOSIS — E785 Hyperlipidemia, unspecified: Secondary | ICD-10-CM | POA: Diagnosis not present

## 2017-10-23 DIAGNOSIS — I25110 Atherosclerotic heart disease of native coronary artery with unstable angina pectoris: Secondary | ICD-10-CM

## 2017-10-23 NOTE — Patient Instructions (Addendum)
NO MEDICATION CHANGES    LABS IN AUG 2019 CMP LIPID DO NOT EAT OR DRINK THE MORNING OF THE TEST    Your physician discussed the importance of regular exercise and recommended that you start or continue a regular exercise program for good health.    Your physician recommends that you schedule a follow-up appointment in SEPT 2019 WITH DR HARDING.    If you need a refill on your cardiac medications before your next appointment, please call your pharmacy.

## 2017-10-23 NOTE — Progress Notes (Signed)
CARDIOLOGY CLINIC NOTE  PCP: Marisue Ivan, MD  Clinic Note: Chief Complaint  Patient presents with  . Hospitalization Follow-up    -ACS-PCI  . Coronary Artery Disease    No anginal    HPI: Thomas Beck is a 52 y.o. male with a PMH below who presents today for hospital follow-up after presenting with unstable angina undergoing cardiac catheterization with PCI.Marland Kitchen  Thomas Beck was last seen back in Jan 2017 ini f/u for his CAD - NSTEMI 09/2012 - PCI of LAD (resolved ICM). He had regained all of the weight that he had lost -- lack of discipline with diet & exercise. Was essentially asymptomatic from CV standpoint.  Recent Hospitalizations:   10/04/17 - admitted for ACS - r/o MI --> Cath-PCI RCA.  Studies Personally Reviewed - (if available, images/films reviewed: From Epic Chart or Care Everywhere)  2D echo 10/07/2017 - Normal LV size and function.  Mild LVH.  EF 55-60%.  No RW MA.  GR 1 DD (normal for age).  Mild LA dilation.  CARDIAC CATH-PCI 10/06/2017: 90% mid RCA followed by 60%.  Treated with 2 overlapping DES synergy 4.0 x 24, 4.0 x 8 (4.55 mm postdilated).  PA V 60%.  P-M LAD (Promus 3.0 X 38 mm) widely patent.  D2 40%.  EF 55-65%.  Interval History: Returns today year for his initial post hospital follow-up doing very well with no recurrent symptoms.  He is just below 7 so pretty a lot of some to get back into the poor condition that he was in prior to his first heart attack.  He gained a lot of weight back that has been lost to follow-up.  Not sure if he been taking all of his medications either.  Currently he now has no active angina symptoms with rest or exertion.  Doing very well, trying to start back walking. Basically cardiac review of symptoms is negative as follows: No chest pain or shortness of breath with rest or exertion. No PND, orthopnea or edema. No palpitations, lightheadedness, dizziness, weakness or syncope/near syncope. No TIA/amaurosis fugax symptoms. No  melena, hematochezia, hematuria, or epstaxis. No claudication.    He states his PCP should be due to check exam and follow-up labs in September of October.  ROS: A comprehensive was performed. Review of Systems  Constitutional: Negative for malaise/fatigue and weight loss.  HENT: Negative for congestion.   Respiratory: Negative for shortness of breath.   Gastrointestinal: Negative for abdominal pain and heartburn.  Musculoskeletal: Negative for joint pain.  Neurological: Negative for dizziness and focal weakness. Weakness:    Endo/Heme/Allergies: Bruises/bleeds easily.  Psychiatric/Behavioral: Positive for depression (He is a little bit down after this last event).  All other systems reviewed and are negative.  I have reviewed and (if needed) personally updated the patient's problem list, medications, allergies, past medical and surgical history, social and family history.   Past Medical History:  Diagnosis Date  . CAD S/P percutaneous coronary angioplasty 10/06/2012   a) 100% occluded mLAD --> PCI with Promus Premier DES (3.0 mm x 28 mm);; b) 10/06/2017: 90% & 60% mRCA -> 2 Overlapping Synergy DES (4.0 x 24, 4.0 x 8 -- 4.55 mm), PAV ~60%, LAD stents patent, D2 40%. EF 55-60%  . Cardiomyopathy, ischemic: 10/06/2012   Ejection fraction 35-40% grade 2 diastolic dysfunction - Resolved with f/u Echo normal EF 55-60% no RWMA in 01/2013.  Marland Kitchen Dyslipidemia 09/27/2012  . Hyperglycemia 09/27/2012  . Kidney stone   . Metabolic syndrome - DM,  HLD, Obesity 09/30/2012  . NSTEMI (non-ST elevated myocardial infarction) - possible Missed STEMI 09/27/2012   ~100% p-m LAD occlusion with anterior Hypokinesis -- PCI with long DES -- associated short term ICM  . Obesity, morbid (more than 100 lbs over ideal weight or BMI > 40) (HCC)     Past Surgical History:  Procedure Laterality Date  . CORONARY STENT INTERVENTION N/A 10/06/2017   Procedure: CORONARY STENT INTERVENTION;  Surgeon: Marykay Lex, MD;   Location: [facility] INVASIVE CV LAB;  Service: Cardiovascular;  Laterality: N/A;; mRCA 90% -60% lesions -> 0%. 2 Overlapping Synergy DES 4.0 x 24 & 4.0 x 8 --> post-dilated to 4.55 mm  . LEFT HEART CATH AND CORONARY ANGIOGRAPHY N/A 10/06/2017   Procedure: LEFT HEART CATH AND CORONARY ANGIOGRAPHY;  Surgeon: Marykay Lex, MD;  Location: [facility] INVASIVE CV LAB;  Service: Cardiovascular;;; CULPRIT 90%&60% mRCA (DES PCI), rPAV 60%, p-m LAD stents patent. D2 40%, EF 55-60% no RWMA.  Marland Kitchen LEFT HEART CATHETERIZATION WITH CORONARY ANGIOGRAM N/A 09/28/2012   Procedure: LEFT HEART CATHETERIZATION WITH CORONARY ANGIOGRAM;  Surgeon: Marykay Lex, MD;  Location: [facility] CATH LAB;  Service: Cardiology:  Severe 1VCAD --100% mid LAD occlusion just after Mod-large D1 with a ~40% ost-prox  . PERCUTANEOUS CORONARY STENT INTERVENTION (PCI-S)  09/28/2012   Procedure: PERCUTANEOUS CORONARY STENT INTERVENTION (PCI-S);  Surgeon: Marykay Lex, MD;  Location: [facility] CATH LAB;  Service: Cardiovascular;; Mid LAD: Promus Premier DES 3.0 mm x 28 mm (post-dilated to 3.4 mm proximal, 3.3 mm mid & 3.15 mm distal)  . TRANSTHORACIC ECHOCARDIOGRAM  01/2013; 09/2017   a) 01/2013: EF 55-60%, no regional WMA;; b) 10/07/2017:  Normal LV size and function.  Mild LVH.  EF 55-60%.  No RW MA.  GR 1 DD (normal for age).  Mild LA dilation.  . TRANSTHORACIC ECHOCARDIOGRAM  09/27/2012   EF 35-40%, moderately reduced. Severe hypokinesis of the mid to distal anteroseptal, anterior and apical myocardium consistent LAD infarction. Grade 2 diastolic dysfunction.    Current Meds  Medication Sig  . aspirin EC 81 MG tablet Take 1 tablet (81 mg total) by mouth daily.  Marland Kitchen atorvastatin (LIPITOR) 80 MG tablet Take 1 tablet (80 mg total) by mouth daily at 6 PM. NEEDS APPOINTMENT FOR FUTURE REFILLS (Patient taking differently: Take 40 mg by mouth daily at 6 PM. NEEDS APPOINTMENT FOR FUTURE REFILLS)  . clopidogrel (PLAVIX) 75 MG tablet Take 1 tablet (75 mg total) by mouth daily.  Marland Kitchen  lisinopril (PRINIVIL,ZESTRIL) 2.5 MG tablet Take 1 tablet (2.5 mg total) by mouth daily. <PLEASE MAKE APPOINTMENT FOR REFILLS>  . metFORMIN (GLUCOPHAGE) 500 MG tablet Take 1 tablet (500 mg total) by mouth 2 (two) times daily with a meal.  . nitroGLYCERIN (NITROSTAT) 0.4 MG SL tablet Place 1 tablet (0.4 mg total) under the tongue every 5 (five) minutes as needed for chest pain. MAX 3 DOSES  . pantoprazole (PROTONIX) 40 MG tablet TAKE 1 TABLET DAILY AT 12:00 NOON  . tamsulosin (FLOMAX) 0.4 MG CAPS Take 0.4 mg by mouth daily after supper.    Allergies  Allergen Reactions  . Strawberry Extract Itching    Social History   Tobacco Use  . Smoking status: Never Smoker  . Smokeless tobacco: Never Used  Substance Use Topics  . Alcohol use: Yes    Comment: seldom  . Drug use: Not on file   Social History   Social History Narrative   He is a married high school principal. His daughter  just graduated from high school. He has not been that active up to the time of his PCI for STEMI/non-STEMI. He has picked up his level of exercise and is trying to adjust his diet.   He is using a pedometer to measure now point wall a daily basis.    family history includes Sudden death in his other.  Wt Readings from Last 3 Encounters:  10/23/17 (!) 308 lb (139.7 kg)  10/07/17 (!) 311 lb 8.2 oz (141.3 kg)  06/20/15 (!) 311 lb (141.1 kg)    PHYSICAL EXAM BP 112/66 (BP Location: Left Arm, Patient Position: Sitting, Cuff Size: Large)   Pulse 81   Ht 5\' 8"  (1.727 m)   Wt (!) 308 lb (139.7 kg)   BMI 46.83 kg/m  Physical Exam   Adult ECG Report  Rate: 81 ;  Rhythm: normal sinus rhythm and Anteroseptal MI, age return.  Inferior T wave inversions from recent hospitalizations no longer present.;   Narrative Interpretation: Stable/improved EKG   Other studies Reviewed: Additional studies/ records that were reviewed today include:  Recent Labs:   Lab Results  Component Value Date   CREATININE 1.23  10/07/2017   BUN 10 10/07/2017   NA 136 10/07/2017   K 4.1 10/07/2017   CL 105 10/07/2017   CO2 24 10/07/2017    Lab Results  Component Value Date   CHOL 185 10/07/2017   HDL 32 (L) 10/07/2017   LDLCALC 131 (H) 10/07/2017   TRIG 112 10/07/2017   CHOLHDL 5.8 10/07/2017     ASSESSMENT / PLAN: Problem List Items Addressed This Visit    Obesity, morbid (more than 100 lbs over ideal weight or BMI > 40) (HCC) (Chronic)    Unfortunately, he gained all the way back that he had lost.  He needs to get back into diet.  Modification and I counseled his wife as well.  Needs to get been exercising.  Simply cannot afford to get back into the same old routine.        Relevant Orders   Lipid panel   Comprehensive metabolic panel   NSTEMI (non-ST elevated myocardial infarction) - possible Missed STEMI (Chronic)    Prior LAD infarct, but thankfully is myopathy has resolved with no regional wall motion.  Now with close call in the RCA.  Totally different vessel before my same results.  Please make left so modifications.  Continue pretty much lifelong Plavix, statin and beta-blocker.      Relevant Orders   EKG 12-Lead (Completed)   Comprehensive metabolic panel   Metabolic syndrome - DM, HLD, Obesity (Chronic)    Low HDL, hypertension, obesity concerning for cardiac risk and diabetes. We will see what his fasting glucose is doing next labs.  If they are elevated, will probably need to check an A1c.      Relevant Orders   Lipid panel   Comprehensive metabolic panel   Hyperglycemia (Chronic)    No longer on metformin.  Need to follow labs closely.      Dyslipidemia, goal LDL below 70 (Chronic)    LDL now quite well controlled.  He is back on atorvastatin 80 mg daily.  Will need to follow-up closely.  If does not meet target will need CVRR referral ([facility] - here with our Clinical Pharmacists)       Relevant Orders   Lipid panel   Coronary artery disease  involving native coronary artery of native heart with unstable angina pectoris (HCC) -  Primary (Chronic)    Patient milligrams stents and new stents to the RCA.  No recurrent anginal symptoms.  He is back on aspirin plus Plavix with no bleeding issues.  On beta-blocker and ACE inhibitor as well as statin. -Maintain current regimen for now.  He probably will not have time to do cardiac rehab, but during the summer may actually try to do some.  He will let us know.  We will be happy with the referral.         I spent a total of with the patient and chart review. >  50% of the time was spent in direct patient consultation.   Current medicines are reviewed at length with the patient today.  (+/- concerns) n/a The following changes have been made:  n/a  Patient Instructions  NO MEDICATION CHANGES    LABS IN AUG 2019 CMP LIPID DO NOT EAT OR DRINK THE MORNING OF THE TEST    Your physician discussed the importance of regular exercise and recommended that you start or continue a regular exercise program for good health.    Your physician recommends that you schedule a follow-up appointment in SEPT 2019 WITH DR Sharanya Templin.    If you need a refill on your cardiac medications before your next appointment, please call your pharmacy.     Studies Ordered:   Orders Placed This Encounter  Procedures  . Lipid panel  . Comprehensive metabolic panel  . EKG 12-Lead      Bryan Lemma, M.D., M.S. Interventional Cardiologist   Pager # [phone] Phone # [phone] [address] [place], [place] [address]   Thank you for choosing [facility] at [facility]!!

## 2017-10-26 ENCOUNTER — Encounter: Payer: Self-pay | Admitting: Cardiology

## 2017-10-26 NOTE — Assessment & Plan Note (Signed)
LDL now quite well controlled.  He is back on atorvastatin 80 mg daily.  Will need to follow-up closely.  If does not meet target will need CVRR referral ([facility] - here with our Clinical Pharmacists)

## 2017-10-26 NOTE — Assessment & Plan Note (Signed)
Prior LAD infarct, but thankfully is myopathy has resolved with no regional wall motion.  Now with close call in the RCA.  Totally different vessel before my same results.  Please make left so modifications.  Continue pretty much lifelong Plavix, statin and beta-blocker.

## 2017-10-26 NOTE — Assessment & Plan Note (Signed)
Unfortunately, he gained all the way back that he had lost.  He needs to get back into diet.  Modification and I counseled his wife as well.  Needs to get been exercising.  Simply cannot afford to get back into the same old routine.

## 2017-10-26 NOTE — Assessment & Plan Note (Addendum)
Patient milligrams stents and new stents to the RCA.  No recurrent anginal symptoms.  He is back on aspirin plus Plavix with no bleeding issues.  On beta-blocker and ACE inhibitor as well as statin. -Maintain current regimen for now.  He probably will not have time to do cardiac rehab, but during the summer may actually try to do some.  He will let us know.  We will be happy with the referral.

## 2017-10-26 NOTE — Assessment & Plan Note (Signed)
Low HDL, hypertension, obesity concerning for cardiac risk and diabetes. We will see what his fasting glucose is doing next labs.  If they are elevated, will probably need to check an A1c.

## 2017-10-26 NOTE — Assessment & Plan Note (Signed)
No longer on metformin.  Need to follow labs closely.

## 2018-01-26 ENCOUNTER — Ambulatory Visit (INDEPENDENT_AMBULATORY_CARE_PROVIDER_SITE_OTHER): Payer: 59 | Admitting: Cardiology

## 2018-01-26 ENCOUNTER — Encounter: Payer: Self-pay | Admitting: Cardiology

## 2018-01-26 VITALS — BP 118/72 | HR 64 | Ht 68.0 in | Wt 308.0 lb

## 2018-01-26 DIAGNOSIS — I2510 Atherosclerotic heart disease of native coronary artery without angina pectoris: Secondary | ICD-10-CM

## 2018-01-26 DIAGNOSIS — I25110 Atherosclerotic heart disease of native coronary artery with unstable angina pectoris: Secondary | ICD-10-CM | POA: Diagnosis not present

## 2018-01-26 DIAGNOSIS — E8881 Metabolic syndrome: Secondary | ICD-10-CM | POA: Diagnosis not present

## 2018-01-26 DIAGNOSIS — Z9861 Coronary angioplasty status: Secondary | ICD-10-CM

## 2018-01-26 DIAGNOSIS — E785 Hyperlipidemia, unspecified: Secondary | ICD-10-CM

## 2018-01-26 NOTE — Progress Notes (Signed)
CARDIOLOGY CLINIC NOTE  PCP: Marisue Ivan, MD  Clinic Note: Chief Complaint  Patient presents with  . Follow-up    No complaints  . Coronary Artery Disease    Recent PCI    HPI: Thomas Beck is a 51 y.o. male with a PMH below who presents today for hospital follow-up after presenting with unstable angina undergoing cardiac catheterization with PCI.Marland Kitchen  Ruby Dilone was last seen back in June  Jan 2017 ini f/u for his CAD - NSTEMI 09/2012 - PCI of LAD (resolved ICM). He had regained all of the weight that he had lost -- lack of discipline with diet & exercise. Was essentially asymptomatic from CV standpoint.  Recent Hospitalizations:   10/04/17 - admitted for ACS - r/o MI --> Cath-PCI RCA.  Studies Personally Reviewed - (if available, images/films reviewed: From Epic Chart or Care Everywhere)    Interval History: Returns today year for his 2nd post hospital follow-up from his recent ACS presentation with PCI to the RCA.  He has not had any further anginal symptoms with rest or exertion.  He is trying to get back active with intentions of getting more exercise.  Is hoping to lose weight.  He denies any chest tightness or pressure with rest or exertion.  No exertional dyspnea. No melena, hematochezia, epistaxis or hematuria on DAPT.  Cardiovascular ROS: no chest pain or dyspnea on exertion negative for - edema, irregular heartbeat, orthopnea, palpitations, paroxysmal nocturnal dyspnea, rapid heart rate, shortness of breath or Syncope/near syncope, TIA shows amaurosis fugax, claudication.  He states his PCP should be due to check exam and follow-up labs in September of October.  ROS: A comprehensive was performed. Review of Systems  Constitutional: Negative for malaise/fatigue and weight loss.  HENT: Negative for congestion.   Respiratory: Negative for shortness of breath.   Gastrointestinal: Negative for abdominal pain and heartburn.  Musculoskeletal: Negative for joint pain.   Neurological: Negative for dizziness and focal weakness. Weakness:    Endo/Heme/Allergies: Bruises/bleeds easily.  Psychiatric/Behavioral: Negative for depression (Seems to be less upset several months out from cath.).  All other systems reviewed and are negative.  I have reviewed and (if needed) personally updated the patient's problem list, medications, allergies, past medical and surgical history, social and family history.   Past Medical History:  Diagnosis Date  . CAD S/P percutaneous coronary angioplasty 10/06/2012   a) 100% occluded mLAD --> PCI with Promus Premier DES (3.0 mm x 28 mm);; b) 10/06/2017: 90% & 60% mRCA -> 2 Overlapping Synergy DES (4.0 x 24, 4.0 x 8 -- 4.55 mm), PAV ~60%, LAD stents patent, D2 40%. EF 55-60%  . Cardiomyopathy, ischemic: 10/06/2012   Ejection fraction 35-40% grade 2 diastolic dysfunction - Resolved with f/u Echo normal EF 55-60% no RWMA in 01/2013.  Marland Kitchen Dyslipidemia 09/27/2012  . Hyperglycemia 09/27/2012  . Kidney stone   . Metabolic syndrome - DM, HLD, Obesity 09/30/2012  . NSTEMI (non-ST elevated myocardial infarction) - possible Missed STEMI 09/27/2012   ~100% p-m LAD occlusion with anterior Hypokinesis -- PCI with long DES -- associated short term ICM  . Obesity, morbid (more than 100 lbs over ideal weight or BMI > 40) (HCC)     Past Surgical History:  Procedure Laterality Date  . CORONARY STENT INTERVENTION N/A 10/06/2017   Procedure: CORONARY STENT INTERVENTION;  Surgeon: Marykay Lex, MD;  Location: [facility] INVASIVE CV LAB;  Service: Cardiovascular;  Laterality: N/A;; mRCA 90% -60% lesions -> 0%. 2 Overlapping Synergy DES  4.0 x 24 & 4.0 x 8 --> post-dilated to 4.55 mm  . LEFT HEART CATH AND CORONARY ANGIOGRAPHY N/A 10/06/2017   Procedure: LEFT HEART CATH AND CORONARY ANGIOGRAPHY;  Surgeon: Marykay Lex, MD;  Location: [facility] INVASIVE CV LAB;  Service: Cardiovascular;;; CULPRIT 90%&60% mRCA (DES PCI), rPAV 60%, p-m LAD stents patent. D2 40%, EF 55-60%  no RWMA.  Marland Kitchen LEFT HEART CATHETERIZATION WITH CORONARY ANGIOGRAM N/A 09/28/2012   Procedure: LEFT HEART CATHETERIZATION WITH CORONARY ANGIOGRAM;  Surgeon: Marykay Lex, MD;  Location: [facility] CATH LAB;  Service: Cardiology:  Severe 1VCAD --100% mid LAD occlusion just after Mod-large D1 with a ~40% ost-prox  . PERCUTANEOUS CORONARY STENT INTERVENTION (PCI-S)  09/28/2012   Procedure: PERCUTANEOUS CORONARY STENT INTERVENTION (PCI-S);  Surgeon: Marykay Lex, MD;  Location: [facility] CATH LAB;  Service: Cardiovascular;; Mid LAD: Promus Premier DES 3.0 mm x 28 mm (post-dilated to 3.4 mm proximal, 3.3 mm mid & 3.15 mm distal)  . TRANSTHORACIC ECHOCARDIOGRAM  01/2013; 09/2017   a) 01/2013: EF 55-60%, no regional WMA;; b) 10/07/2017:  Normal LV size and function.  Mild LVH.  EF 55-60%.  No RW MA.  GR 1 DD (normal for age).  Mild LA dilation.  . TRANSTHORACIC ECHOCARDIOGRAM  09/27/2012   EF 35-40%, moderately reduced. Severe hypokinesis of the mid to distal anteroseptal, anterior and apical myocardium consistent LAD infarction. Grade 2 diastolic dysfunction.    CARDIAC CATH-PCI 10/06/2017: 90% mid RCA followed by 60%.  Treated with 2 overlapping DES synergy 4.0 x 24, 4.0 x 8 (4.55 mm postdilated).  PA V 60%.  P-M LAD (Promus 3.0 X 38 mm) widely patent.  D2 40%.  EF 55-65%.   Current Meds  Medication Sig  . aspirin EC 81 MG tablet Take 1 tablet (81 mg total) by mouth daily.  Marland Kitchen atorvastatin (LIPITOR) 80 MG tablet Take 1 tablet (80 mg total) by mouth daily at 6 PM. NEEDS APPOINTMENT FOR FUTURE REFILLS (Patient taking differently: Take 80 mg by mouth daily at 6 PM. Take 1 tablet in the evening)  . clopidogrel (PLAVIX) 75 MG tablet Take 1 tablet (75 mg total) by mouth daily.  Marland Kitchen lisinopril (PRINIVIL,ZESTRIL) 2.5 MG tablet Take 1 tablet (2.5 mg total) by mouth daily. <PLEASE MAKE APPOINTMENT FOR REFILLS>  . metFORMIN (GLUCOPHAGE) 500 MG tablet Take 1 tablet (500 mg total) by mouth 2 (two) times daily with a meal.  . metoprolol  succinate (TOPROL-XL) 25 MG 24 hr tablet Take by mouth.  . nitroGLYCERIN (NITROSTAT) 0.4 MG SL tablet Place 1 tablet (0.4 mg total) under the tongue every 5 (five) minutes as needed for chest pain. MAX 3 DOSES  . pantoprazole (PROTONIX) 40 MG tablet TAKE 1 TABLET DAILY AT 12:00 NOON  . tamsulosin (FLOMAX) 0.4 MG CAPS Take 0.4 mg by mouth daily after supper.    Allergies  Allergen Reactions  . Strawberry Extract Itching    Social History   Tobacco Use  . Smoking status: Never Smoker  . Smokeless tobacco: Never Used  Substance Use Topics  . Alcohol use: Yes    Comment: seldom  . Drug use: Not on file   Social History   Social History Narrative   He is a married high school principal. His daughter just graduated from high school. He has not been that active up to the time of his PCI for STEMI/non-STEMI. He has picked up his level of exercise and is trying to adjust his diet.   He is using a  pedometer to measure now point wall a daily basis.    family history includes Sudden death in his other.  Wt Readings from Last 3 Encounters:  01/26/18 (!) 308 lb (139.7 kg)  10/23/17 (!) 308 lb (139.7 kg)  10/07/17 (!) 311 lb 8.2 oz (141.3 kg)    PHYSICAL EXAM BP 118/72   Pulse 64   Ht 5\' 8"  (1.727 m)   Wt (!) 308 lb (139.7 kg)   SpO2 98%   BMI 46.83 kg/m  Physical Exam  Constitutional: He is oriented to person, place, and time. He appears well-developed and well-nourished. No distress.  HENT:  Head: Normocephalic and atraumatic.  Neck: Normal range of motion. No hepatojugular reflux and no JVD (Difficult to assess due to body habitus) present. Carotid bruit is not present.  Cardiovascular: Normal rate, regular rhythm, S1 normal, S2 normal, intact distal pulses and normal pulses.  No extrasystoles are present. PMI is not displaced (Unable to palpate). Exam reveals distant heart sounds. Exam reveals no gallop and no friction rub.  No murmur heard. Pulmonary/Chest: Effort normal and  breath sounds normal. No respiratory distress. He has no wheezes. He has no rales.  Abdominal: Soft. Bowel sounds are normal. He exhibits no distension. There is no tenderness. There is no rebound.  Unable to palpate any HSM  Musculoskeletal: Normal range of motion. He exhibits edema (1+ bilateral lower extremity).  Neurological: He is alert and oriented to person, place, and time.  Psychiatric: He has a normal mood and affect. His behavior is normal. Judgment and thought content normal.  Vitals reviewed.    Adult ECG Report Not checked  Other studies Reviewed: Additional studies/ records that were reviewed today include:  Recent Labs:   Lab Results  Component Value Date   CREATININE 1.23 10/07/2017   BUN 10 10/07/2017   NA 136 10/07/2017   K 4.1 10/07/2017   CL 105 10/07/2017   CO2 24 10/07/2017    Lab Results  Component Value Date   CHOL 185 10/07/2017   HDL 32 (L) 10/07/2017   LDLCALC 131 (H) 10/07/2017   TRIG 112 10/07/2017   CHOLHDL 5.8 10/07/2017     ASSESSMENT / PLAN: Problem List Items Addressed This Visit    Coronary artery disease involving native coronary artery of native heart with unstable angina pectoris (HCC) (Chronic)    No recurrent angina symptoms on current medications.  He is on beta-blocker, aspirin and Plavix as well as ACE inhibitor.  Did not do cardiac rehab, but is hoping exercise on his own.      Coronary artery disease: 09/28/2012: 100% occluded midLAD - PCI w/ Promus drug eluting stent - Primary (Chronic)    Interestingly, he came back in for unstable angina 4 years out from his initial MI.  He had no notable lesions in the RCA treated with DES stent. No further anginal symptoms.  He is back on Plavix (because of issues with Brilinta in the past). Remains on high-dose statin have been increased to 80 mg daily, low-dose lisinopril along with Toprol. Was switched from Lopressor to Toprol for his history of cardiomyopathy.      Dyslipidemia,  goal LDL below 70 (Chronic)    Lipids remain poorly controlled.  Atorvastatin was increased to 80 mg.  Is due for recheck by PCP in the next month or so.  Depending on those labs look like I may require CV RR ([facility]-lipid clinic, run by clinical pharmacist) follow-up.  Metabolic syndrome - DM, HLD, Obesity (Chronic)    High risk for cardiac disease, already has disease.  High risk for more with obesity, hypertension and low HDL. PCP is following for early diabetes.  He is on metformin now.      Obesity, morbid (more than 100 lbs over ideal weight or BMI > 40) (HCC) (Chronic)    He had initially done very well having lost a lot of weight, but now gained it all back prior to this last cath.  I stressed the importance of getting back into a diet and exercise program that will allow him to lose his weight again.         I spent a total of 25 minutes with the patient and chart review. >  50% of the time was spent in direct patient consultation.   Current medicines are reviewed at length with the patient today.  (+/- concerns) n/a The following changes have been made:  n/a  Patient Instructions  NO MEDICATION CHANGES    PLEASE HAVE PRIMARY SEND A COPY OF LIPID/CHOLESTEROL LEVEL TO THE OFFICE     Your physician wants you to follow-up in 6 MONTHS WITH DR HARDING.You will receive a reminder letter in the mail two months in advance. If you don't receive a letter, please call our office to schedule the follow-up appointment.     If you need a refill on your cardiac medications before your next appointment, please call your pharmacy.     Studies Ordered:   No orders of the defined types were placed in this encounter.     Bryan Lemma, M.D., M.S. Interventional Cardiologist   Pager # [phone] Phone # [phone] [place], [place] [address]   Thank you for choosing [facility] at [facility]!!

## 2018-01-26 NOTE — Patient Instructions (Signed)
NO MEDICATION CHANGES    PLEASE HAVE PRIMARY SEND A COPY OF LIPID/CHOLESTEROL LEVEL TO THE OFFICE     Your physician wants you to follow-up in 6 MONTHS WITH DR HARDING.You will receive a reminder letter in the mail two months in advance. If you don't receive a letter, please call our office to schedule the follow-up appointment.     If you need a refill on your cardiac medications before your next appointment, please call your pharmacy.

## 2018-01-27 ENCOUNTER — Encounter: Payer: Self-pay | Admitting: Cardiology

## 2018-01-27 NOTE — Assessment & Plan Note (Signed)
High risk for cardiac disease, already has disease.  High risk for more with obesity, hypertension and low HDL. PCP is following for early diabetes.  He is on metformin now.

## 2018-01-27 NOTE — Assessment & Plan Note (Signed)
No recurrent angina symptoms on current medications.  He is on beta-blocker, aspirin and Plavix as well as ACE inhibitor.  Did not do cardiac rehab, but is hoping exercise on his own.

## 2018-01-27 NOTE — Assessment & Plan Note (Signed)
Interestingly, he came back in for unstable angina 4 years out from his initial MI.  He had no notable lesions in the RCA treated with DES stent. No further anginal symptoms.  He is back on Plavix (because of issues with Brilinta in the past). Remains on high-dose statin have been increased to 80 mg daily, low-dose lisinopril along with Toprol. Was switched from Lopressor to Toprol for his history of cardiomyopathy.

## 2018-01-27 NOTE — Assessment & Plan Note (Signed)
He had initially done very well having lost a lot of weight, but now gained it all back prior to this last cath.  I stressed the importance of getting back into a diet and exercise program that will allow him to lose his weight again.

## 2018-01-27 NOTE — Assessment & Plan Note (Signed)
Lipids remain poorly controlled.  Atorvastatin was increased to 80 mg.  Is due for recheck by PCP in the next month or so.  Depending on those labs look like I may require CV RR ([facility]ovascular Risk Reduction [facility]-lipid clinic, run by clinical pharmacist) follow-up.

## 2019-08-31 ENCOUNTER — Other Ambulatory Visit: Payer: Self-pay

## 2019-08-31 ENCOUNTER — Encounter: Payer: Self-pay | Admitting: Cardiology

## 2019-08-31 ENCOUNTER — Ambulatory Visit (INDEPENDENT_AMBULATORY_CARE_PROVIDER_SITE_OTHER): Payer: 59 | Admitting: Cardiology

## 2019-08-31 VITALS — BP 128/84 | HR 67 | Ht 68.0 in | Wt 305.0 lb

## 2019-08-31 DIAGNOSIS — E785 Hyperlipidemia, unspecified: Secondary | ICD-10-CM

## 2019-08-31 DIAGNOSIS — Z9861 Coronary angioplasty status: Secondary | ICD-10-CM | POA: Diagnosis not present

## 2019-08-31 DIAGNOSIS — I25110 Atherosclerotic heart disease of native coronary artery with unstable angina pectoris: Secondary | ICD-10-CM

## 2019-08-31 DIAGNOSIS — R739 Hyperglycemia, unspecified: Secondary | ICD-10-CM | POA: Diagnosis not present

## 2019-08-31 DIAGNOSIS — E8881 Metabolic syndrome: Secondary | ICD-10-CM

## 2019-08-31 DIAGNOSIS — I2510 Atherosclerotic heart disease of native coronary artery without angina pectoris: Secondary | ICD-10-CM | POA: Diagnosis not present
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
## 2019-08-31 NOTE — Patient Instructions (Signed)
Medication Instructions:  No changes  *If you need a refill on your cardiac medications before your next appointment, please call your pharmacy*   Lab Work: cmp lipid  - please have labs sent to office  If you have labs (blood work) drawn today and your tests are completely normal, you will receive your results only by: Marland Kitchen MyChart Message (if you have MyChart) OR . A paper copy in the mail If you have any lab test that is abnormal or we need to change your treatment, we will call you to review the results.   Testing/Procedures: Not needed   Follow-Up: At [facility], you and your health needs are our priority.  As part of our continuing mission to provide you with exceptional heart care, we have created designated Provider Care Teams.  These Care Teams include your primary Cardiologist (physician) and Advanced Practice Providers (APPs -  Physician Assistants and Nurse Practitioners) who all work together to provide you with the care you need, when you need it.  We recommend signing up for the patient portal called "MyChart".  Sign up information is provided on this After Visit Summary.  MyChart is used to connect with patients for Virtual Visits (Telemedicine).  Patients are able to view lab/test results, encounter notes, upcoming appointments, etc.  Non-urgent messages can be sent to your provider as well.   To learn more about what you can do with MyChart, go to [email or web address].    Your next appointment:   12 month(s)  The format for your next appointment:   In Person  Provider:   Bryan Lemma, MD  You have been referred to  [facility] TO DISCUSS LIPIDS  Other Instructions  n/a

## 2019-09-05 ENCOUNTER — Encounter: Payer: Self-pay | Admitting: Cardiology

## 2019-09-05 NOTE — Assessment & Plan Note (Signed)
Clearly at a high risk for coronary disease with two-vessel PCI now.  Blood pressures controlled, but he now has diabetes and poorly controlled lipids along with obesity.

## 2019-09-05 NOTE — Assessment & Plan Note (Signed)
Placed back on Metformin by his PCP.

## 2019-09-05 NOTE — Assessment & Plan Note (Signed)
Now status post PCI to both the LAD and RCA.  Otherwise minimal disease.  2 stents in the RCA and one in the LAD.  No further anginal symptoms.  No bleeding issues on Plavix  On maintenance dose of Toprol and lisinopril with well-controlled blood pressure.  PCP recently changed his statin to Crestor 40 mg from atorvastatin based on last labs.  Is on maintenance dose clopidogrel/Plavix

## 2019-09-05 NOTE — Assessment & Plan Note (Signed)
Unfortunately, by last labs, his LDL was still poorly controlled.  Very difficult to manage when I will see him frequently.  He had labs checked by PCP in November.  My plan is to give him a lab slip to go into his PCPs office and get it checked again-lipid panel with CMP. Lab results will be sent to [place] and we will refer him to [facility] ([facility] run by her clinical pharmacist.  I suspect that he would probably need PCSK9 inhibitor. Most importantly, he needs to lose weight

## 2019-09-05 NOTE — Assessment & Plan Note (Signed)
We talked about weight loss for quite a long time.  He had done relatively well given that of 290 pounds but then gained it all back.  He needs to get on and maintain dietary adjustment and exercise.  Every time he loses weight he gained it back.  Biggest issue has been consistent sustained effort.  As long as he is this much overweight, his lipids and sugars would not be controlled.

## 2019-09-05 NOTE — Assessment & Plan Note (Signed)
Almost 4 years to the date from his initial STEMI, he presented with ACS--found to have de novo lesions in the RCA with patent LAD stent.  Attempt to cover with 1 stent lead led to mild proximal edge dissection covered with a second smaller stent proximally.  Maintaining treatment on clopidogrel, but okay to hold for 5 to 7 days preop surgery

## 2019-09-08 ENCOUNTER — Ambulatory Visit: Payer: 59 | Admitting: Cardiology

## 2019-11-02 ENCOUNTER — Ambulatory Visit: Payer: 59

## 2019-11-24 IMAGING — DX DG CHEST 2V
2 series · 2 of 2 positions shown · non-contrast
Comparison: None.

CLINICAL DATA: Pt is having chest pains on left side says he had
M.I 5 years ago and has a stent put in , pain started early today
says he took two nitro.

EXAM:
CHEST - 2 VIEW

[chest pa]
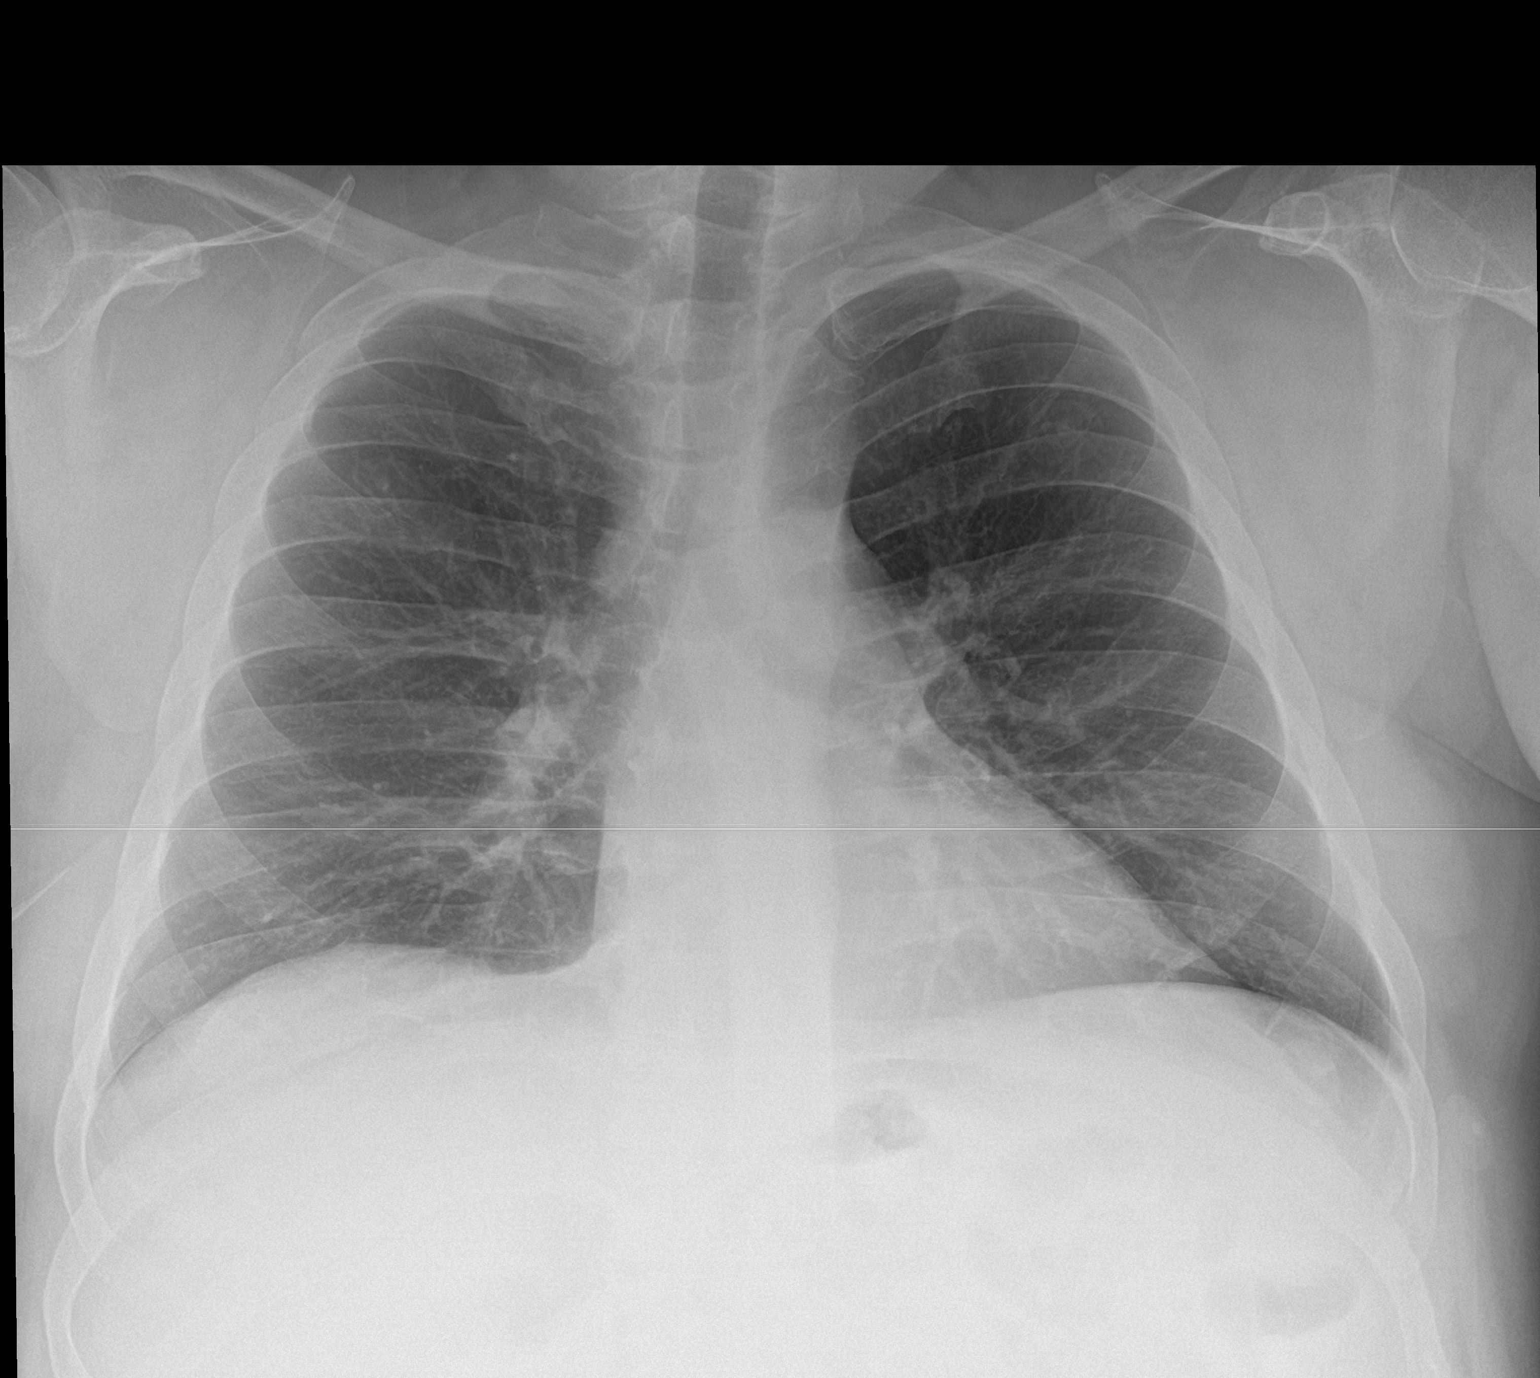

[chest lat]
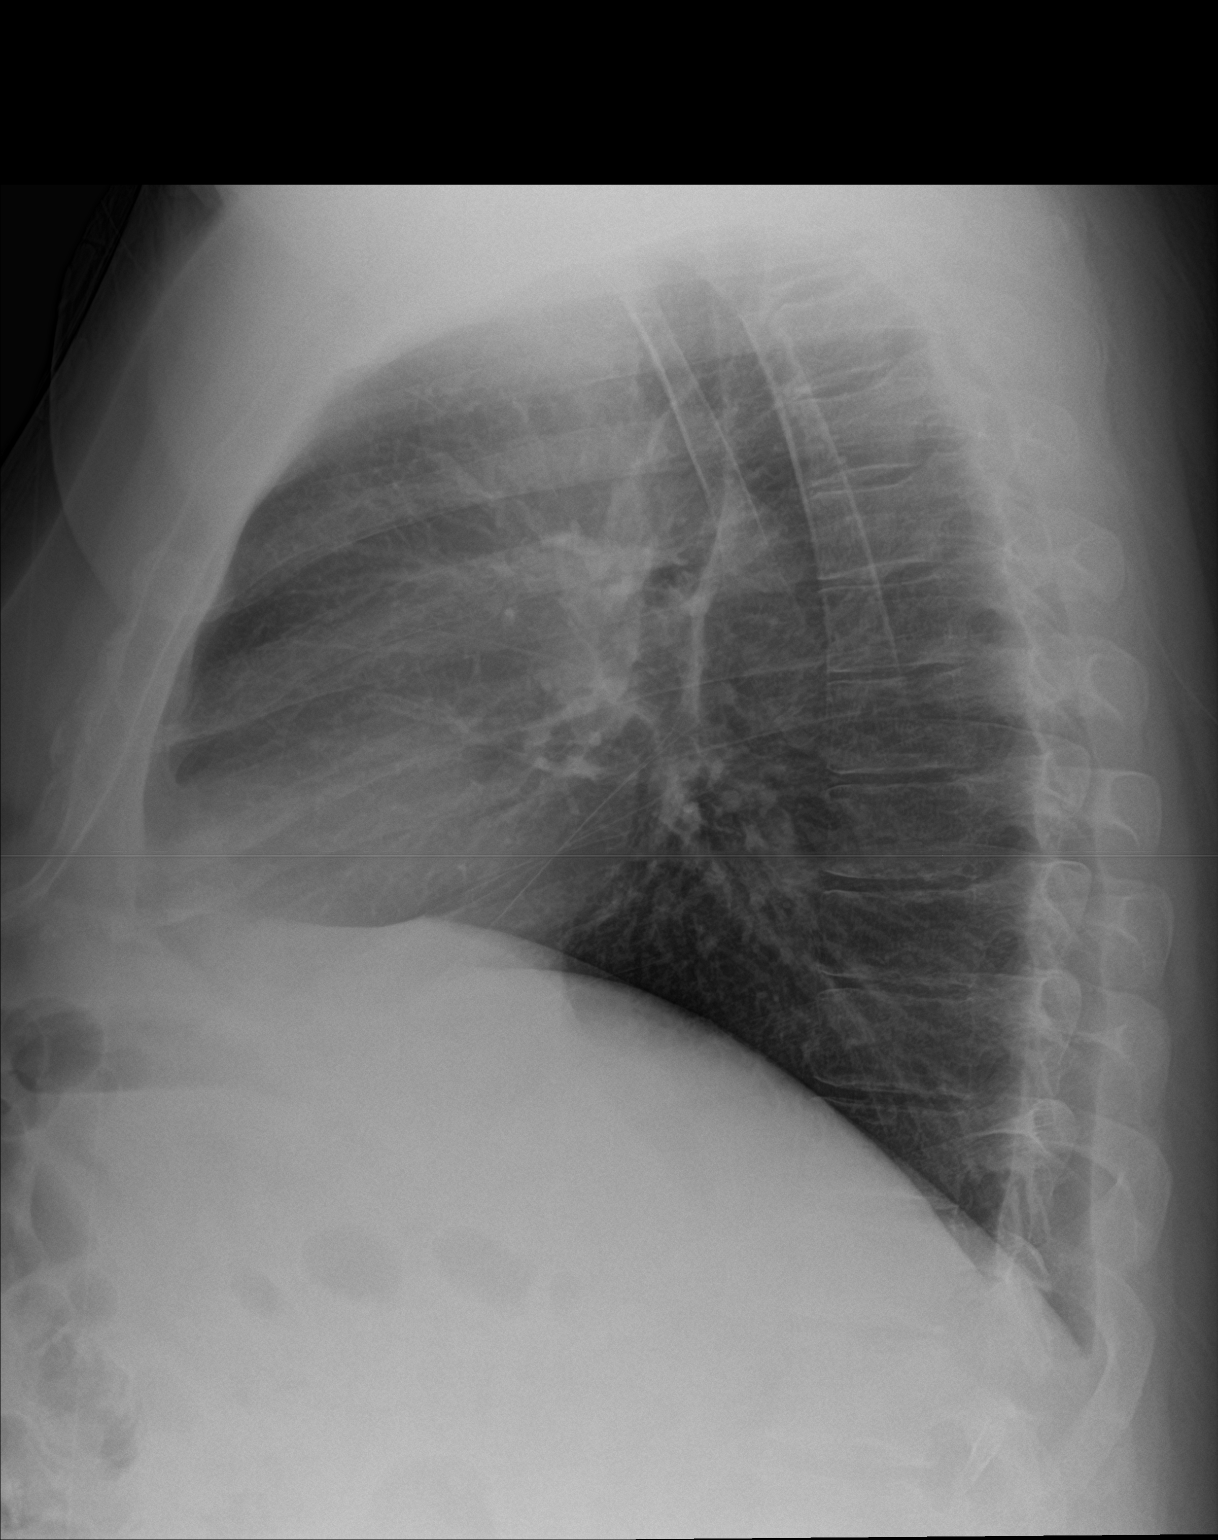

[2 of 2 positions shown; findings below may reference images not displayed]

FINDINGS: Normal mediastinum and cardiac silhouette. Normal pulmonary
vasculature. No evidence of effusion, infiltrate, or pneumothorax.
No acute bony abnormality.

Thin post processing line artifact bisects the radiograph
top-to-bottom.
IMPRESSION: No acute cardiopulmonary process.

## 2019-11-25 ENCOUNTER — Ambulatory Visit: Payer: 59

## 2019-12-30 ENCOUNTER — Ambulatory Visit: Payer: 59

## 2020-02-22 ENCOUNTER — Ambulatory Visit: Payer: 59

## 2020-03-07 ENCOUNTER — Ambulatory Visit: Payer: 59

## 2020-10-16 ENCOUNTER — Encounter: Payer: Self-pay | Admitting: Cardiology

## 2020-10-17 ENCOUNTER — Ambulatory Visit (INDEPENDENT_AMBULATORY_CARE_PROVIDER_SITE_OTHER): Payer: 59 | Admitting: Cardiology

## 2020-10-17 ENCOUNTER — Other Ambulatory Visit: Payer: Self-pay

## 2020-10-17 VITALS — BP 124/78 | HR 64 | Ht 68.0 in | Wt 313.6 lb

## 2020-10-17 DIAGNOSIS — E1169 Type 2 diabetes mellitus with other specified complication: Secondary | ICD-10-CM

## 2020-10-17 DIAGNOSIS — I10 Essential (primary) hypertension: Secondary | ICD-10-CM

## 2020-10-17 DIAGNOSIS — I2510 Atherosclerotic heart disease of native coronary artery without angina pectoris: Secondary | ICD-10-CM

## 2020-10-17 DIAGNOSIS — Z9861 Coronary angioplasty status: Secondary | ICD-10-CM | POA: Diagnosis not present

## 2020-10-17 DIAGNOSIS — I214 Non-ST elevation (NSTEMI) myocardial infarction: Secondary | ICD-10-CM | POA: Diagnosis not present

## 2020-10-17 DIAGNOSIS — E785 Hyperlipidemia, unspecified: Secondary | ICD-10-CM
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
## 2020-10-17 NOTE — Patient Instructions (Signed)
Medication Instructions:   Not needec *If you need a refill on your cardiac medications before your next appointment, please call your pharmacy*   Lab Work:  Not needed   Testing/Procedures:  Not needed  Follow-Up: At [facility], you and your health needs are our priority.  As part of our continuing mission to provide you with exceptional heart care, we have created designated Provider Care Teams.  These Care Teams include your primary Cardiologist (physician) and Advanced Practice Providers (APPs -  Physician Assistants and Nurse Practitioners) who all work together to provide you with the care you need, when you need it.     Your next appointment:   12 month(s)  The format for your next appointment:   In Person  Provider:   Bryan Lemma, MD

## 2020-10-28 ENCOUNTER — Encounter: Payer: Self-pay | Admitting: Cardiology

## 2020-10-28 DIAGNOSIS — I10 Essential (primary) hypertension: Secondary | ICD-10-CM | POA: Insufficient documentation

## 2020-10-28 NOTE — Assessment & Plan Note (Addendum)
His last labs finally showed the benefit of switching to rosuvastatin plus ezetimibe.  Now only if he can lose weight, we could also bring his cholesterol controlled to an A1c below 6.  He is only on metformin for his diabetes.  We would consider the possibility of GLP-1 agonist or even potentially Rybelsus for glycemic control and weight loss.

## 2020-10-28 NOTE — Assessment & Plan Note (Signed)
Blood pressure well controlled on current dose of Toprol and lisinopril.

## 2020-10-28 NOTE — Assessment & Plan Note (Signed)
He clearly is not capable of losing weight on his own.  May want to consider referral to nutritionist, even potentially gastric bypass surgery.  With him being a diabetic, consider GLP-1 agonist even to the point of Rybelsus for glycemic control and weight loss.

## 2020-10-28 NOTE — Assessment & Plan Note (Signed)
8 years out from his MI with LAD PCI, 5 years out from his ACS presentation with RCA PCI.  Follow-up echoes have shown EF back to normal with normal wall motion.  No further active anginal or heart failure symptoms.  Plan:   Continue low-dose Toprol and lisinopril  Continue rosuvastatin plus ezetimibe with excellent lipid control.  Need to continue to work on diet and exercise for weight loss.  Continue glycemic control-metformin consider SGLT2 inhibitor and/or GLP-1 agonist.  On lifelong clopidogrel/Plavix monotherapy for extensive PCI  Okay to hold clopidogrel/Plavix preop for surgery or procedures.  (5 days for most procedures, 7 days for high risk/neurologic/spinal procedures.)

## 2020-10-28 NOTE — Assessment & Plan Note (Signed)
His initial non-STEMI resulted in an anterior wall motion normality noted on echocardiogram.  Thankfully this has fully resolved, with no further wall motion abnormality and normal EF.  His presentation with unstable angina did not result in myocardial injury.  He had PCI to the RCA.  With extensive stents in the LAD and RCA, would recommend lifelong Plavix. He also remains on stable statin and beta-blocker dose.Thomas Beck

## 2021-05-02 ENCOUNTER — Telehealth: Payer: Self-pay

## 2021-05-02 NOTE — Telephone Encounter (Signed)
° °  Name: Thomas Beck  DOB: June 02, 1965  MRN: [id number]   Primary Cardiologist: Bryan Lemma, MD  Chart reviewed as part of pre-operative protocol coverage. Patient was contacted 05/02/2021 in reference to pre-operative risk assessment for pending surgery as outlined below.  Thomas Beck was last seen 09/2020 by Dr. Herbie Baltimore. I reached out to patient for update on how he is doing. The patient affirms he has been doing well without any new cardiac symptoms. He has dropped 34lb and his A1C has improved from 6.8 to 6.1. No new chest pain or dyspnea. Therefore, based on ACC/AHA guidelines, the patient would be at acceptable risk for the planned procedure without further cardiovascular testing. The patient was advised that if he develops new symptoms prior to surgery to contact our office to arrange for a follow-up visit, and he verbalized understanding.  Per Dr. Elissa Hefty last note, the patient may hold Plavix prior to procedure. We generally hold Plavix for 5 days prior to a colonoscopy. We typically advise that blood thinners be resumed when felt safe by performing physician.  I will route this recommendation to the requesting party via Epic fax function and remove from pre-op pool. Please call with questions.  Laurann Montana, PA-C 05/02/2021, 4:57 PM

## 2021-05-02 NOTE — Telephone Encounter (Signed)
° °  Pre-operative Risk Assessment    Patient Name: Thomas Beck  DOB: 19-Nov-1965 MRN: [id number]      Request for Surgical Clearance    Procedure:  Colonoscopy  Date of Surgery:  Clearance TBD                                 Surgeon:  Dr.Tom Rosine Beat Surgeon's Group or Practice Name:  [facility] Phone number:  [phone] Fax number:  [phone]   Type of Clearance Requested:   Pharmacy Plavix Hold   Type of Anesthesia:  Not Indicated   Additional requests/questions:  Please fax a copy of Clearance to the surgeon's office.  [name]   05/02/2021, 3:50 PM

## 2021-06-29 ENCOUNTER — Telehealth: Payer: Self-pay

## 2021-06-29 NOTE — Telephone Encounter (Signed)
Left a message for the patient to call back and speak to the on-call preop APP of the day.  Note, patient was previously cleared for colonoscopy in December and was cleared to hold Plavix for 5 days prior to that procedure.

## 2021-06-29 NOTE — Telephone Encounter (Signed)
I s/w Dr. Cindee Salt office and confirmed that clearance has been re-faxed to [phone].

## 2021-06-29 NOTE — Telephone Encounter (Signed)
° °  Name: Thomas Beck  DOB: 09-06-1965  MRN: [id number]   Primary Cardiologist: Glenetta Hew, MD  Chart reviewed as part of pre-operative protocol coverage. Patient was contacted 06/29/2021 in reference to pre-operative risk assessment for pending surgery as outlined below.  Thomas Beck was last seen on 10/17/2020 by Dr. Glenetta Hew.  Since that day, Thomas Beck has done well without exertional chest pain or worsening dyspnea.  Therefore, based on ACC/AHA guidelines, the patient would be at acceptable risk for the planned procedure without further cardiovascular testing.   Patient may hold Plavix for 5 days prior to the procedure and restart as soon as possible afterward at the surgeon's discretion  The patient was advised that if he develops new symptoms prior to surgery to contact our office to arrange for a follow-up visit, and he verbalized understanding.  I will route this recommendation to the requesting party via Epic fax function and remove from pre-op pool. Please call with questions.  [place], [place] 06/29/2021, 3:42 PM

## 2021-06-29 NOTE — Telephone Encounter (Signed)
I had recent to cardiac clearance letter back.  Please verify with the requesting provider to see if they have received it.

## 2021-06-29 NOTE — Telephone Encounter (Signed)
Calling back to get update inform them that the office already responded. Asking that re send the information over.

## 2021-06-29 NOTE — Telephone Encounter (Signed)
° °  Pre-operative Risk Assessment    Patient Name: Thomas Beck  DOB: 23-Sep-1965 MRN: [id number]      Request for Surgical Clearance    Procedure:   Lithotripsy  Date of Surgery:  Clearance 07/06/21                                 Surgeon:  Dr. Debroah Baller Surgeon's Group or Practice Name:  [facility] Phone number:  [phone] Fax number:  [phone]   Type of Clearance Requested:   - Medical  - Pharmacy:  Hold Clopidogrel (Plavix) for 5 days prior.   Type of Anesthesia:  General    Additional requests/questions:  Please advise surgeon/provider what medications should be held. Please fax a copy of Clearance to the surgeon's office.  Signed, Ivin Booty   06/29/2021, 1:25 PM

## 2021-07-02 DIAGNOSIS — R001 Bradycardia, unspecified: Secondary | ICD-10-CM

## 2021-10-23 ENCOUNTER — Ambulatory Visit: Payer: 59 | Admitting: Cardiology

## 2022-02-26 ENCOUNTER — Ambulatory Visit: Payer: 59 | Attending: Cardiology | Admitting: Cardiology

## 2022-02-26 ENCOUNTER — Encounter: Payer: Self-pay | Admitting: Cardiology

## 2022-02-26 VITALS — BP 132/78 | HR 60 | Ht 68.0 in | Wt 281.2 lb

## 2022-02-26 DIAGNOSIS — E785 Hyperlipidemia, unspecified: Secondary | ICD-10-CM

## 2022-02-26 DIAGNOSIS — I10 Essential (primary) hypertension: Secondary | ICD-10-CM

## 2022-02-26 DIAGNOSIS — Z9861 Coronary angioplasty status: Secondary | ICD-10-CM | POA: Diagnosis not present

## 2022-02-26 DIAGNOSIS — I2510 Atherosclerotic heart disease of native coronary artery without angina pectoris: Secondary | ICD-10-CM | POA: Diagnosis not present

## 2022-02-26 DIAGNOSIS — E1169 Type 2 diabetes mellitus with other specified complication: Secondary | ICD-10-CM | POA: Diagnosis not present

## 2022-02-26 DIAGNOSIS — I25110 Atherosclerotic heart disease of native coronary artery with unstable angina pectoris: Secondary | ICD-10-CM | POA: Diagnosis not present

## 2022-02-26 DIAGNOSIS — I214 Non-ST elevation (NSTEMI) myocardial infarction: Secondary | ICD-10-CM

## 2022-02-26 NOTE — Patient Instructions (Addendum)
Medication Instructions:  No changes  *If you need a refill on your cardiac medications before your next appointment, please call your pharmacy*   Lab Work: Not needed    Testing/Procedures:  Not needed  Follow-Up: At [facility], you and your health needs are our priority.  As part of our continuing mission to provide you with exceptional heart care, we have created designated Provider Care Teams.  These Care Teams include your primary Cardiologist (physician) and Advanced Practice Providers (APPs -  Physician Assistants and Nurse Practitioners) who all work together to provide you with the care you need, when you need it.     Your next appointment:   10 month(s)  Aug 2024  The format for your next appointment:   In Person  Provider:   Glenetta Hew, MD

## 2022-02-26 NOTE — Progress Notes (Signed)
Primary Care Provider: Madaline Beck, [place] Cardiologist: Thomas Hew, MD Electrophysiologist: None  Clinic Note: Chief Complaint  Patient presents with   Coronary Artery Disease    No recurrent angina.   Follow-up    Doing well.  Has lost weight.    ===================================  ASSESSMENT/PLAN   Problem List Items Addressed This Visit       Cardiology Problems   CAD-PCI: 05'14: 100% mLAD - PCI w/ Promus DES; 5/'18: ACS - DES PCI  90% & 60% mRCA 2 Overlapping SYNERGY DES (4x24, 4x8 --> 4.55). (Chronic)    Overlapping stents in the RCA and LAD stent.  Based on location of the stents, and overlapping stents in the RCA, he is on maintenance dose of Plavix for stent patency.  Okay to hold Plavix 5 to 7 days preop for surgeries or procedures.      Relevant Orders   EKG 12-Lead (Completed)   Coronary artery disease involving native coronary artery of native heart with unstable angina pectoris ([place]) - Primary (Chronic)    Anterior MI in May 2014 with LAD PCI, 5 years later he had unstable angina with PCI to lesions in the RCA.  No further angina since.  Remains on low-dose beta-blocker (Toprol 25 daily, high-dose rosuvastatin 40 mg daily, plus Zetia 10 mg daily.  On maintenance Plavix with 2 vessel PCI.  No major bleeding issues.      NSTEMI (non-ST elevated myocardial infarction) - possible Missed STEMI (Chronic)    Initial MI was in May 2014.  Probably presentation was more consistent with a missed anterior STEMI.  Significant lesion in the LAD treated with DES PCI.  Notable improvement of EF (originally 35 to 40%, increased up to 55 to 60% 4 months later) on echo.  No CHF symptoms.      Essential hypertension (Chronic)    Blood pressure actually is relatively well controlled today.  A little higher than usual.  His PCP stopped his ACE inhibitor because of lowish blood pressures.  He is only now on low-dose Toprol.      Relevant Orders    EKG 12-Lead (Completed)     Other   Dyslipidemia associated with type 2 diabetes mellitus (HCC) (Chronic)    Last A1c looked pretty good at 6.0.  Currently only on metformin 1000 mg twice daily.  The hope is that with weight loss this will improve.  Has been as high as 6.8 in March 2022.   Last lipid panel was excellent back in November 2022.  Due for labs now.  LDL was 55 at the time on combination rosuvastatin 40 mg and Zetia 10 mg. We will be on the look out for labs from PCP.        Relevant Medications   metFORMIN (GLUCOPHAGE) 1000 MG tablet   Other Relevant Orders   EKG 12-Lead (Completed)   Obesity, morbid (more than 100 lbs over ideal weight or BMI > 40) (HCC) (Chronic)    Also he is lost about 70 pounds that he can tell since his initial MI.  32 pounds in the last year.  Hopefully can continue to drop weight as he remains morbidly obese.  With the weight loss, he is feeling better with better energy levels.  Need to keep pushing toward more weight loss.      Relevant Medications   metFORMIN (GLUCOPHAGE) 1000 MG tablet   ===================================  HPI:    Thomas Beck is a 56 y.o. male with  a PMH below who presents today for delayed annual f/u - at the request of Thomas Beck, Thomas Carwin, MD.  CAD HISTORY:  (10/06/2012) -NSTEMI: DES PCI of LAD (resolved ICM). He had regained all of the weight that he had lost -- lack of discipline with diet & exercise. (10/04/2017) ACS/unstable angina - R/O MI -> DES PCI RCA Morbid obesity, HTN, HLD  Thomas Beck was last seen on Oct 17, 2020-doing well from a cardiac standpoint.  Was hoping to have more time to get started in an exercise regimen.  Was limited because of left knee pain, therefore he gained back some of the weight that he been losing.  Still no symptoms regarding standpoint.  Just deconditioning and end of day swelling.  Recent Hospitalizations: None  Reviewed  CV studies:    The following studies were reviewed today:  (if available, images/films reviewed: From Epic Chart or Care Everywhere) None:  Interval History:   Thomas Beck returns here today in much better spirits.  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  He actually feels like he has more of an impact with his job that he did his old job and there is much less stress.  He is also able to get out and walk more around school and in classrooms.  He has been eating a little healthier.  He is lost back a lot of the weight now almost 70 pounds since his MI.  Pretty much asymptomatic from a cardiac standpoint besides some exertional dyspnea if he goes up hills quickly.  CV Review of Symptoms (Summary): no chest pain or dyspnea on exertion positive for - still little deconditioned, but working on it.  Doing better with exercise.  Notable weight loss.  Less prominent swelling. negative for - irregular heartbeat, orthopnea, palpitations, paroxysmal nocturnal dyspnea, rapid heart rate, shortness of breath, or lightheadedness or dizziness, syncope/near syncope or TIA/amaurosis fugax, claudication.  REVIEWED OF SYSTEMS   Review of Systems  Constitutional:  Positive for weight loss. Negative for malaise/fatigue (Energy level getting better.).  HENT:  Negative for congestion.   Respiratory:  Negative for cough and shortness of breath.   Gastrointestinal:  Negative for blood in stool and melena.  Genitourinary:  Negative for hematuria.  Musculoskeletal:  Positive for joint pain (His knee is still bothering him, but not limiting activity.).  Neurological:  Negative for dizziness, focal weakness and weakness.  Endo/Heme/Allergies:  Does not bruise/bleed easily.  Psychiatric/Behavioral:  Negative for depression and memory loss. The patient is not nervous/anxious (Notably less stress with his new job.) and does not have insomnia.     I have reviewed and (if needed) personally updated the patient's  problem list, medications, allergies, past medical and surgical history, social and family history.   PAST MEDICAL HISTORY   Past Medical History:  Diagnosis Date   CAD S/P percutaneous coronary angioplasty 10/06/2012   a) 100% occluded mLAD --> PCI with Promus Premier DES (3.0 mm x 28 mm);; b) 10/06/2017: 90% & 60% mRCA -> 2 Overlapping Synergy DES (4.0 x 24, 4.0 x 8 -- 4.55 mm), PAV ~60%, LAD stents patent, D2 40%. EF 55-60%   Cardiomyopathy, ischemic: Resolved 10/06/2012   Ejection fraction 83-66% grade 2 diastolic dysfunction - Resolved with f/u Echo normal EF 55-60% no RWMA in 01/2013.   Dyslipidemia 09/27/2012   Hyperglycemia 09/27/2012   Kidney stone    Metabolic syndrome - DM, HLD, Obesity 09/30/2012   NSTEMI (  non-ST elevated myocardial infarction) - possible Missed STEMI 09/27/2012   ~100% p-m LAD occlusion with anterior Hypokinesis -- PCI with long DES -- associated short term ICM   Obesity, morbid (more than 100 lbs over ideal weight or BMI > 40) ([place])     PAST SURGICAL HISTORY   Past Surgical History:  Procedure Laterality Date   CORONARY STENT INTERVENTION N/A 10/06/2017   Procedure: CORONARY STENT INTERVENTION;  Surgeon: Leonie Man, MD;  Location: [facility];  Service: Cardiovascular;  Laterality: N/A;; mRCA 90% -60% lesions -> 0%. 2 Overlapping Synergy DES 4.0 x 24 & 4.0 x 8 --> post-dilated to 4.55 mm   LEFT HEART CATH AND CORONARY ANGIOGRAPHY N/A 10/06/2017   Procedure: LEFT HEART CATH AND CORONARY ANGIOGRAPHY;  Surgeon: Leonie Man, MD;  Location: [facility];  Service: Cardiovascular;;; CULPRIT 90%&60% mRCA (DES PCI), rPAV 60%, p-m LAD stents patent. D2 40%, EF 55-60% no RWMA.   LEFT HEART CATHETERIZATION WITH CORONARY ANGIOGRAM N/A 09/28/2012   Procedure: LEFT HEART CATHETERIZATION WITH CORONARY ANGIOGRAM;  Surgeon: Leonie Man, MD;  Location: [facility] CATH LAB;  Service: Cardiology:  Severe 1VCAD --100% mid LAD occlusion just after Mod-large D1 with a  ~40% ost-prox   PERCUTANEOUS CORONARY STENT INTERVENTION (PCI-S)  09/28/2012   Procedure: PERCUTANEOUS CORONARY STENT INTERVENTION (PCI-S);  Surgeon: Leonie Man, MD;  Location: [facility] C[facility];  Service: Cardiovascular;; Mid LAD: Promus Premier DES 3.0 mm x 28 mm (post-dilated to 3.4 mm proximal, 3.3 mm mid & 3.15 mm distal)   TRANSTHORACIC ECHOCARDIOGRAM  01/2013; 09/2017   a) 01/2013: EF 55-60%, no regional WMA;; b) 10/07/2017:  Normal LV size and function.  Mild LVH.  EF 55-60%.  No RW MA.  GR 1 DD (normal for age).  Mild LA dilation.   TRANSTHORACIC ECHOCARDIOGRAM  09/27/2012   EF 35-40%, moderately reduced. Severe hypokinesis of the mid to distal anteroseptal, anterior and apical myocardium consistent LAD infarction. Grade 2 diastolic dysfunction.     There is no immunization history on file for this patient.  MEDICATIONS/ALLERGIES   Current Meds  Medication Sig   clopidogrel (PLAVIX) 75 MG tablet Take 1 tablet (75 mg total) by mouth daily.   ezetimibe (ZETIA) 10 MG tablet Take 10 mg by mouth.   metFORMIN (GLUCOPHAGE) 1000 MG tablet Take 1,000 mg by mouth 2 (two) times daily.   metoprolol succinate (TOPROL-XL) 25 MG 24 hr tablet Take by mouth.   pantoprazole (PROTONIX) 40 MG tablet TAKE 1 TABLET DAILY AT 12:00 NOON   rosuvastatin (CRESTOR) 40 MG tablet Take 40 mg by mouth daily.   Tadalafil (CIALIS PO) Take by mouth.   tamsulosin (FLOMAX) 0.4 MG CAPS Take 0.4 mg by mouth daily after supper.    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  He is overall of  the classes such as shop and woodworking etc.      He and his wife have both switched jobs recently.  This major change was a great  source of stress relief.      Over the last few years he has made some changes in his diet and exercise and has lost close to 70 pounds since his MI, down 32 pounds from last year 2022.Alycia Patten -PE, EKG, labs   Wt Readings from Last 3 Encounters:  02/26/22 281 lb 3.2 oz (127.6 kg)  10/17/20 (!) 313 lb 9.6 oz (142.2 kg)  08/31/19 (!) 305 lb (138.3 kg)    Physical Exam: BP 132/78   Pulse 60   Ht _0  (1.727 m)   Wt 281 lb 3.2 oz (127.6 kg)   BMI 42.76 kg/m  Physical Exam Vitals reviewed.  Constitutional:      General: He is not in acute distress.    Appearance: Normal appearance. He is obese. He is not ill-appearing or diaphoretic.     Comments: Great spirits.  Healthy-appearing and well-groomed.  Has lost weight.  HENT:     Head: Normocephalic and atraumatic.  Neck:     Vascular: No carotid bruit or JVD.  Cardiovascular:     Rate and Rhythm: Normal rate and regular rhythm. No extrasystoles are present.    Chest Wall: PMI is not displaced.     Pulses: Intact distal pulses.     Heart sounds: S1 normal and S2 normal. Heart sounds are distant. No murmur heard.    No friction rub. No gallop.  Pulmonary:     Effort: Pulmonary effort is normal. No respiratory distress.     Breath sounds: Normal breath sounds. No wheezing, rhonchi or rales.  Chest:     Chest wall: No tenderness.  Musculoskeletal:        General: No swelling. Normal range of motion.     Cervical back: Normal range of motion and neck supple.  Skin:    General: Skin is warm and dry.  Neurological:     General: No focal deficit present.     Mental Status: He is alert and oriented to person, place, and time.     Gait: Gait normal.  Psychiatric:        Mood and Affect: Mood normal.        Behavior: Behavior normal.        Thought Content: Thought content normal.         Judgment: Judgment normal.      Adult ECG Report  Rate: 60 ;  Rhythm: normal sinus rhythm and anteroseptal MI, age indeterminate ; ~ low Voltage in limb leads  Narrative Interpretation: stable  Recent Labs:  checked by PCP  Hemoglobin A1c Component 12/14/21 03/30/21  08/08/20   Hemoglobin A1c 6.0 High   6.1 High   6.8 High    Comprehensive Metabolic Panel Component 35/32/99 03/30/21 08/08/20  Glucose 83 86 129 High   BUN _1 Creatinine 1.18 1.25 1.11  eGFR 73 69 80  BUN/Creatinine Ratio _2 Sodium 142 139 141  Potassium 5.0 4.8 4.9  Chloride 105 101 103  CO2 _3 CALCIUM 9.2 9.2 9.3  Total Protein 6.7 6.8 6.8  Albumin, Serum 4.3 4.4 4.1  Globulin, Total 2.4 2.4 2.7  Albumin/Globulin Ratio 1.8 1.8 1.5  Total Bilirubin 0.4 0.5 0.5  Alkaline Phosphatase 88 81 92  AST _4 ALT (SGPT) _5 LP+Non-HDL Cholesterol Component 03/30/21  08/08/20  02/10/20   Cholesterol, Total 119 132 169  Triglycerides 85 84 88  HDL  47 50 45  VLDL Cholesterol Cal _0 LDL 55 66     ================================================== I spent a total of 19 minutes with the patient spent in direct patient consultation.  Additional time spent with chart review  / charting (studies, outside notes, etc): 15 min Total Time: 34 min  Current medicines are reviewed at length with the patient today.  (+/- concerns) none  Notice: This dictation was prepared with Dragon dictation along with smart phrase technology. Any transcriptional errors that result from this process are unintentional and may not be corrected upon review.  Studies Ordered:   Orders Placed This Encounter  Procedures   EKG 12-Lead   No orders of the defined types were placed in this encounter.   Patient Instructions / Medication Changes & Studies & Tests Ordered   Patient Instructions  Medication Instructions:  No changes  *If you need a refill on your cardiac medications before your next  appointment, please call your pharmacy*   Lab Work: Not needed    Testing/Procedures:  Not needed  Follow-Up: At [facility], you and your health needs are our priority.  As part of our continuing mission to provide you with exceptional heart care, we have created designated Provider Care Teams.  These Care Teams include your primary Cardiologist (physician) and Advanced Practice Providers (APPs -  Physician Assistants and Nurse Practitioners) who all work together to provide you with the care you need, when you need it.     Your next appointment:   10 month(s)  Aug 2024  The format for your next appointment:   In Person  Provider:   Glenetta Hew, MD        Leonie Man, MD, MS Thomas Beck, M.D., M.S. Interventional Cardiologist  [place]  Pager # [phone] Phone # [phone] [address] [place], [place] [address]   Thank you for choosing [facility]!!

## 2022-03-24 ENCOUNTER — Encounter: Payer: Self-pay | Admitting: Cardiology

## 2022-03-24 NOTE — Assessment & Plan Note (Signed)
Last A1c looked pretty good at 6.0.  Currently only on metformin 1000 mg twice daily.  The hope is that with weight loss this will improve.  Has been as high as 6.8 in March 2022.   Last lipid panel was excellent back in November 2022.  Due for labs now.  LDL was 55 at the time on combination rosuvastatin 40 mg and Zetia 10 mg. We will be on the look out for labs from PCP.

## 2022-03-24 NOTE — Assessment & Plan Note (Signed)
Blood pressure actually is relatively well controlled today.  A little higher than usual.  His PCP stopped his ACE inhibitor because of lowish blood pressures.  He is only now on low-dose Toprol.

## 2022-03-24 NOTE — Assessment & Plan Note (Addendum)
Initial MI was in May 2014.  Probably presentation was more consistent with a missed anterior STEMI.  Significant lesion in the LAD treated with DES PCI.  Notable improvement of EF (originally 35 to 40%, increased up to 55 to 60% 4 months later) on echo.  No CHF symptoms.

## 2022-03-24 NOTE — Assessment & Plan Note (Signed)
Also he is lost about 70 pounds that he can tell since his initial MI.  32 pounds in the last year.  Hopefully can continue to drop weight as he remains morbidly obese.  With the weight loss, he is feeling better with better energy levels.  Need to keep pushing toward more weight loss.

## 2022-03-24 NOTE — Assessment & Plan Note (Signed)
Overlapping stents in the RCA and LAD stent.  Based on location of the stents, and overlapping stents in the RCA, he is on maintenance dose of Plavix for stent patency.  Okay to hold Plavix 5 to 7 days preop for surgeries or procedures.

## 2022-03-24 NOTE — Assessment & Plan Note (Addendum)
Anterior MI in May 2014 with LAD PCI, 5 years later he had unstable angina with PCI to lesions in the RCA.  No further angina since.  Remains on low-dose beta-blocker (Toprol 25 daily, high-dose rosuvastatin 40 mg daily, plus Zetia 10 mg daily.  On maintenance Plavix with 2 vessel PCI.  No major bleeding issues.

## 2023-10-29 ENCOUNTER — Encounter: Payer: Self-pay | Admitting: Cardiology

## 2023-10-29 ENCOUNTER — Ambulatory Visit: Payer: PRIVATE HEALTH INSURANCE | Attending: Cardiology | Admitting: Cardiology

## 2023-10-29 VITALS — BP 125/82 | HR 71 | Ht 68.0 in | Wt 302.2 lb

## 2023-10-29 DIAGNOSIS — Z9861 Coronary angioplasty status: Secondary | ICD-10-CM

## 2023-10-29 DIAGNOSIS — I10 Essential (primary) hypertension: Secondary | ICD-10-CM | POA: Diagnosis not present

## 2023-10-29 DIAGNOSIS — E785 Hyperlipidemia, unspecified: Secondary | ICD-10-CM

## 2023-10-29 DIAGNOSIS — E1169 Type 2 diabetes mellitus with other specified complication: Secondary | ICD-10-CM

## 2023-10-29 DIAGNOSIS — I214 Non-ST elevation (NSTEMI) myocardial infarction: Secondary | ICD-10-CM

## 2023-10-29 DIAGNOSIS — I2510 Atherosclerotic heart disease of native coronary artery without angina pectoris: Secondary | ICD-10-CM

## 2023-10-29 DIAGNOSIS — I25110 Atherosclerotic heart disease of native coronary artery with unstable angina pectoris: Secondary | ICD-10-CM

## 2023-10-29 NOTE — Patient Instructions (Signed)
 Medication Instructions:   Not changes *If you need a refill on your cardiac medications before your next appointment, please call your pharmacy*   Lab Work: Not needed    Testing/Procedures:  Not needed  Follow-Up: At [facility], you and your health needs are our priority.  As part of our continuing mission to provide you with exceptional heart care, we have created designated Provider Care Teams.  These Care Teams include your primary Cardiologist (physician) and Advanced Practice Providers (APPs -  Physician Assistants and Nurse Practitioners) who all work together to provide you with the care you need, when you need it.     Your next appointment:   12 month(s)  The format for your next appointment:   In Person  Provider:   Lawana Pray, NP or Palmer Bobo, NP      Then, Randene Bustard, MD will plan to see you again in 24 month(s).

## 2023-11-01 ENCOUNTER — Encounter: Payer: Self-pay | Admitting: Cardiology

## 2023-11-01 NOTE — Assessment & Plan Note (Signed)
 Lost 21 pounds from when he had started epic.  He is now in the process of trying to titrate up Ozempic and help with his weight.  He tried to get down to the 220 range.  Encourage his efforts, recommended continued management with Ozempic

## 2023-11-01 NOTE — Assessment & Plan Note (Addendum)
 Goal LDL is less than 55-most recent LDL was 53.  I am expecting with his weight loss on Ozempic, but this will even improve further.  Most recent A1c was 8.  PCP started Ozempic for combination of diabetes and weight loss.  Plan:  - Continue rosuvastatin 40 mg daily along with Zetia 10 mg daily. - Continue current dose of metformin  along with Ozempic, increase dose to 1 mg as planned. - Emphasize hydration, especially in hot weather, aiming for 60-80 ounces of water daily. - Monitor blood glucose levels regularly. - Followed by PCP.

## 2023-11-01 NOTE — Assessment & Plan Note (Signed)
 Blood pressure well-controlled with current medication regimen. - Continue Toprol  25 mg. - Monitor blood pressure regularly.

## 2023-11-01 NOTE — Progress Notes (Signed)
 Cardiology Office Note:  .   Date:  11/01/2023  ID:  Thomas Beck, DOB September 09, 1965, MRN [id number] PCP: Lanell Pinta, MD  [facility] Providers Cardiologist:  Randene Bustard, MD     Chief Complaint  Patient presents with   Follow-up    Delayed follow-up.  Last seen October 2023   Coronary Artery Disease    No angina    Patient Profile: .     Thomas Beck is a morbidly obese 58 y.o. male  with a PMH reviewed below who presents here for delayed follow-up at the request of Kipreos, Lyle San, MD.  CAD HISTORY: CAD: -Two-vessel PCI NSTEM (10/06/2012) I: DES PCI of LAD (resolved ICM). He had regained all of the weight that he had lost -- lack of discipline with diet & exercise. (10/04/2017) ACS/unstable angina - R/O MI -> DES PCI RCA Morbid obesity, HTN, HLD     Thomas Beck was last seen on February 26, 2022-weight was down to 281.  No changes made.  Plan was to consider Ozempic.  No medication changes made.  Plan was for 52-month follow-up.  Subjective  Discussed the use of AI scribe software for clinical note transcription with the patient, who gave verbal consent to proceed.  History of Present Illness History of Present Illness Thomas Beck is a 58 year old male with coronary artery disease and diabetes who presents for a follow-up visit.  He has a history of coronary artery disease, with a myocardial infarction in 2014 that required stent placements in the right coronary artery and LAD. No recent chest pain or symptoms similar to his previous heart attack. During his last heart attack, he experienced dull shoulder pain for two days, unrelieved by stretching.  He is managing his diabetes with Ozempic, started in March after his HbA1c increased from 6% to 8%. Since starting Ozempic, he has lost 22 pounds. He is on a 0.5 mg dose and plans to increase to 1 mg. He notes decreased appetite and thirst, especially in the initial days following the injection, and needs to  consciously eat due to his diabetes.  His cholesterol is managed with Crestor 40 mg and Zetia 10 mg, with recent lipid levels showing total cholesterol at 112 mg/dL, triglycerides at 95 mg/dL, HDL at 41 mg/dL, and LDL at 53 mg/dL. He is also on Toprol  25 mg for blood pressure and heart rate management, and Plavix  for antiplatelet therapy. No bleeding issues, such as blood in stools or epistaxis.  He experiences an increased heart rate up to 150 bpm when exposed to heat, particularly while playing golf, which he attributes to dehydration. He is retiring next Tuesday after 35 years of work, including 4 years in the [employer], and has worked as a Personnel officer.     Objective   Current Meds  Medication Sig   clopidogrel  (PLAVIX ) 75 MG tablet Take 1 tablet (75 mg total) by mouth daily.   metFORMIN  (GLUCOPHAGE ) 1000 MG tablet Take 1,000 mg by mouth 2 (two) times daily.   pantoprazole  (PROTONIX ) 40 MG tablet TAKE 1 TABLET DAILY AT 12:00 NOON   rosuvastatin (CRESTOR) 40 MG tablet Take 40 mg by mouth daily.   Tadalafil (CIALIS PO) Take by mouth.   tamsulosin  (FLOMAX ) 0.4 MG CAPS Take 0.4 mg by mouth daily after supper.   He apparently is also on Ozempic 0.5 mg with plans to increase and titrate further.  Studies Reviewed: Thomas Beck   EKG Interpretation Date/Time:  Wednesday October 29 2023 17:08:10 EDT Ventricular Rate:  71 PR Interval:  188 QRS Duration:  84 QT Interval:  368 QTC Calculation: 399 R Axis:   38  Text Interpretation: Normal sinus rhythm Anterior infarct (cited on or before 06-Oct-2017) When compared with ECG of 07-Oct-2017 06:17, Questionable change in initial forces of Septal leads T wave inversion NO LONGER PRESENT Inferior leads Confirmed by Randene Bustard ([id number]) on 10/29/2023 5:28:37 PM    Previous Studies: Echocardiogram: Normal left ventricular function 55 to 60%.  Mild LVH.  No RWMA.  GR 1 DD.  Mild LA dilation but otherwise normal. Cardiac  catheterization: Minimal troponin elevation, mRCA 99% & 60% (2-overlapping-DES PCI: Synergy XD 4.0 x 24 and 4.0 x 8 mm--4.55 mm), rPAV 60%; prox-mid LAD Stents patent; Ost D2 40%.  (10/06/2017) Diagnostic: Dominance: Right      Intervention: Synergy DES 4.0 x 24 & 4.0 x 8 mm (4.55 mm)     No new studies  Labs from [facility]: TC 112, TG 95, HDL 41, LDL 53, A1c 8.0, TSH 1.43;Glu 101, BUN/Cr 11/1.04, Na 139, K+ 4.3.  Normal LFTs.  (09/20/2023)  Risk Assessment/Calculations:          Physical Exam:   VS:  BP 125/82 (BP Location: Right Arm, Patient Position: Sitting, Cuff Size: Large)   Pulse 71   Ht 5' 8 (1.727 m)   Wt (!) 302 lb 3.2 oz (137.1 kg)   SpO2 96%   BMI 45.95 kg/m    Wt Readings from Last 3 Encounters:  10/29/23 (!) 302 lb 3.2 oz (137.1 kg)  02/26/22 281 lb 3.2 oz (127.6 kg)  10/17/20 (!) 313 lb 9.6 oz (142.2 kg)    GEN: Well nourished, well groomed in no acute distress; remains morbidly obese NECK: No JVD; No carotid bruits CARDIAC: Normal S1, S2; RRR, no murmurs, rubs, gallops RESPIRATORY:  Clear to auscultation without rales, wheezing or rhonchi ; nonlabored, good air movement. ABDOMEN: Soft, non-tender, non-distended EXTREMITIES:  No edema; No deformity      ASSESSMENT AND PLAN: .    Problem List Items Addressed This Visit       Cardiology Problems   CAD-PCI: 05'14: 100% mLAD - PCI w/ Promus DES; 5/'18: ACS - DES PCI  90% & 60% mRCA 2 Overlapping SYNERGY DES (4x24, 4x8 --> 4.55). (Chronic)   History of two-vessel PCI involving LAD and RCA with extensive stented segment.  Is on maintenance Plavix  75 mg daily. Okay to hold Plavix  5 to 7 days preop for surgeries or procedures.  Also okay to hold for significant bleeding or bruising      Coronary artery disease involving native coronary artery of native heart with unstable angina pectoris (HCC) - Primary (Chronic)   Coronary artery disease stable with stents in RCA and LAD.  No recent cardiac symptoms.  Thomas AasHeart rate  increase in heat likely due to dehydration, and obesity - Continue current medications including Plavix  75 mg daily, Toprol -XL 25 mg daily, Zetia 10 mL daily along with Nexlizet 40 mg daily. - Emphasized hydration to prevent elevated heart rate in heat. - Schedule follow-up with PA in one year and with cardiologist in two years.      Relevant Orders   EKG 12-Lead (Completed)   Essential hypertension (Chronic)   Blood pressure well-controlled with current medication regimen. - Continue Toprol  25 mg. - Monitor blood pressure regularly.      Relevant Orders   EKG 12-Lead (Completed)   NSTEMI (non-ST elevated myocardial infarction) - possible  Missed STEMI (Chronic)   Initial MI May 2014-likely missed anterior STEMI.  This was LAD PCI.  Followed by ACS presentation in 2019 resulting in overlapping stents placed to the mid RCA cover 2 lesions.  EF now back to baseline.  No active angina or heart failure symptoms.        Other   Dyslipidemia associated with type 2 diabetes mellitus (HCC) (Chronic)   Goal LDL is less than 55-most recent LDL was 53.  I am expecting with his weight loss on Ozempic, but this will even improve further.  Most recent A1c was 8.  PCP started Ozempic for combination of diabetes and weight loss.  Plan:  - Continue rosuvastatin 40 mg daily along with Zetia 10 mg daily. - Continue current dose of metformin  along with Ozempic, increase dose to 1 mg as planned. - Emphasize hydration, especially in hot weather, aiming for 60-80 ounces of water daily. - Monitor blood glucose levels regularly. - Followed by PCP.      Obesity, morbid (more than 100 lbs over ideal weight or BMI > 40) (HCC) (Chronic)   Lost 21 pounds from when he had started epic.  He is now in the process of trying to titrate up Ozempic and help with his weight.  He tried to get down to the 220 range.  Encourage his efforts, recommended continued management with Ozempic           Follow-Up: Return  in about 1 year (around 10/28/2024) for Alternating annual follow-ups APP and MD, [employer].     Signed, Arleen Lacer, MD, MS Randene Bustard, M.D., M.S. Interventional Chartered certified accountant  Pager # [phone]

## 2023-11-01 NOTE — Assessment & Plan Note (Signed)
 Coronary artery disease stable with stents in RCA and LAD.  No recent cardiac symptoms.  Thomas Beck rate increase in heat likely due to dehydration, and obesity - Continue current medications including Plavix  75 mg daily, Toprol -XL 25 mg daily, Zetia 10 mL daily along with Nexlizet 40 mg daily. - Emphasized hydration to prevent elevated heart rate in heat. - Schedule follow-up with PA in one year and with cardiologist in two years.

## 2023-11-01 NOTE — Assessment & Plan Note (Signed)
 Initial MI May 2014-likely missed anterior STEMI.  This was LAD PCI.  Followed by ACS presentation in 2019 resulting in overlapping stents placed to the mid RCA cover 2 lesions.  EF now back to baseline.  No active angina or heart failure symptoms.

## 2023-11-01 NOTE — Assessment & Plan Note (Signed)
 History of two-vessel PCI involving LAD and RCA with extensive stented segment.  Is on maintenance Plavix  75 mg daily. Okay to hold Plavix  5 to 7 days preop for surgeries or procedures.  Also okay to hold for significant bleeding or bruising
